# Patient Record
Sex: Male | Born: 1945 | Race: White | Hispanic: No | Marital: Married | Smoking: Never smoker
Health system: Southern US, Community
[De-identification: ages and names within clinical notes are randomized; demographics above are authoritative.]

## PROBLEM LIST (undated history)

## (undated) DIAGNOSIS — Z87442 Personal history of urinary calculi: Secondary | ICD-10-CM

## (undated) DIAGNOSIS — I10 Essential (primary) hypertension: Secondary | ICD-10-CM

## (undated) DIAGNOSIS — N400 Enlarged prostate without lower urinary tract symptoms: Secondary | ICD-10-CM

## (undated) DIAGNOSIS — G4730 Sleep apnea, unspecified: Secondary | ICD-10-CM

## (undated) DIAGNOSIS — K219 Gastro-esophageal reflux disease without esophagitis: Secondary | ICD-10-CM

## (undated) DIAGNOSIS — E119 Type 2 diabetes mellitus without complications: Secondary | ICD-10-CM

## (undated) HISTORY — PX: OTHER SURGICAL HISTORY: SHX169

## (undated) HISTORY — PX: ROTATOR CUFF REPAIR: SHX139

## (undated) HISTORY — PX: CHOLECYSTECTOMY: SHX55

---

## 2004-07-05 ENCOUNTER — Ambulatory Visit: Payer: Self-pay | Admitting: Cardiology

## 2004-07-06 ENCOUNTER — Ambulatory Visit: Payer: Self-pay | Admitting: Cardiology

## 2004-07-18 ENCOUNTER — Ambulatory Visit: Payer: Self-pay

## 2007-01-07 ENCOUNTER — Ambulatory Visit: Payer: Self-pay | Admitting: Unknown Physician Specialty

## 2008-05-24 ENCOUNTER — Ambulatory Visit: Payer: Self-pay | Admitting: Ophthalmology

## 2008-05-31 ENCOUNTER — Ambulatory Visit: Payer: Self-pay | Admitting: Ophthalmology

## 2009-10-23 ENCOUNTER — Ambulatory Visit: Payer: Self-pay | Admitting: Unknown Physician Specialty

## 2011-03-15 ENCOUNTER — Ambulatory Visit: Payer: Self-pay | Admitting: Sports Medicine

## 2011-06-14 ENCOUNTER — Ambulatory Visit: Payer: Self-pay | Admitting: Unknown Physician Specialty

## 2011-06-14 DIAGNOSIS — I10 Essential (primary) hypertension: Secondary | ICD-10-CM

## 2011-06-14 LAB — URINALYSIS, COMPLETE
Bacteria: NONE SEEN
Bilirubin,UR: NEGATIVE
Blood: NEGATIVE
Ketone: NEGATIVE
Ph: 5 (ref 4.5–8.0)
Protein: 30
Specific Gravity: 1.024 (ref 1.003–1.030)
WBC UR: <1 /HPF (ref 0–5)

## 2011-06-14 LAB — BASIC METABOLIC PANEL
Calcium, Total: 9.2 mg/dL (ref 8.5–10.1)
Co2: 28 mmol/L (ref 21–32)
Creatinine: 0.92 mg/dL (ref 0.60–1.30)
EGFR (African American): >60
EGFR (Non-African Amer.): >60
Glucose: 162 mg/dL — ABNORMAL HIGH (ref 65–99)
Potassium: 3.6 mmol/L (ref 3.5–5.1)
Sodium: 139 mmol/L (ref 136–145)

## 2011-06-14 LAB — CBC
MCH: 29.8 pg (ref 26.0–34.0)
MCHC: 34.8 g/dL (ref 32.0–36.0)
MCV: 86 fL (ref 80–100)
Platelet: 181 10*3/uL (ref 150–440)
RBC: 5.14 10*6/uL (ref 4.40–5.90)
RDW: 13.5 % (ref 11.5–14.5)

## 2011-06-20 ENCOUNTER — Ambulatory Visit: Payer: Self-pay | Admitting: Unknown Physician Specialty

## 2013-09-21 ENCOUNTER — Ambulatory Visit: Payer: Self-pay | Admitting: Ophthalmology

## 2013-10-05 ENCOUNTER — Ambulatory Visit: Payer: Self-pay | Admitting: Ophthalmology

## 2014-05-05 ENCOUNTER — Ambulatory Visit: Payer: Self-pay | Admitting: Ophthalmology

## 2014-07-29 ENCOUNTER — Ambulatory Visit: Payer: Self-pay | Admitting: Internal Medicine

## 2014-09-03 NOTE — Op Note (Signed)
PATIENT NAME:  Joseph Hensley, Joseph Hensley MR#:  [id number] DATE OF BIRTH:  1945/08/16  DATE OF PROCEDURE:  10/05/2013  PREOPERATIVE DIAGNOSIS: Visually significant cataract of the right eye.   POSTOPERATIVE DIAGNOSIS: Visually significant cataract of the right eye.   OPERATIVE PROCEDURE: Cataract extraction by phacoemulsification with implant of intraocular lens to right eye.   SURGEON: Galen ManilaWilliam Donielle Radziewicz, MD.   ANESTHESIA:  1.  Managed anesthesia care.  2.  Topical tetracaine drops followed by 2% Xylocaine jelly applied in the preoperative holding area.   COMPLICATIONS: None.   TECHNIQUE:  Stop and chop.  DESCRIPTION OF PROCEDURE: The patient was examined and consented in the preoperative holding area where the aforementioned topical anesthesia was applied to the right eye and then brought back to the Operating Room where the right eye was prepped and draped in the usual sterile ophthalmic fashion and a lid speculum was placed. A paracentesis was created with the side port blade and the anterior chamber was filled with viscoelastic. A near clear corneal incision was performed with the steel keratome. A continuous curvilinear capsulorrhexis was performed with a cystotome followed by the capsulorrhexis forceps. Hydrodissection and hydrodelineation were carried out with BSS on a blunt cannula. The lens was removed in a stop and chop technique and the remaining cortical material was removed with the irrigation-aspiration handpiece. The capsular bag was inflated with viscoelastic and the Alcon SN60WF 19.0-diopter lens, serial number [id number] was placed in the capsular bag without complication. The remaining viscoelastic was removed from the eye with the irrigation-aspiration handpiece. The wounds were hydrated. The anterior chamber was flushed with Miostat and the eye was inflated to physiologic pressure. 0.1 mL of cefuroxime concentration 10 mg/mL was placed in the anterior chamber. The wounds were found to  be water tight. The eye was dressed with Vigamox. The patient was given protective glasses to wear throughout the day and a shield with which to sleep tonight. The patient was also given drops with which to begin a drop regimen today and will follow-up with me in one day.      ____________________________ Jerilee FieldWilliam L. Ivanka Kirshner, MD wlp:dmm D: 10/05/2013 17:17:50 ET T: 10/05/2013 19:37:28 ET JOB#: [id number]  cc: Earl Zellmer L. Abdelrahman Nair, MD, <Dictator> Jerilee FieldWILLIAM L Davaughn Hillyard MD ELECTRONICALLY SIGNED 10/13/2013 13:42

## 2014-09-04 NOTE — Op Note (Signed)
PATIENT NAME:  [name], Joseph Hensley#:  [id number] DATE OF BIRTH:  11/26/1945  DATE OF PROCEDURE:  06/20/2011  PROCEDURE: Left interscalene nerve block.   INDICATION: To help this patient with postoperative pain about to have a right shoulder arthroscopy, rotator cuff repair, subacromial decompression, and distal clavicle resection by Dr. Ruthann CancerJames Califf.  ATTENDING: Keyarah Mcroy P. Rubye OaksPalmer, MD    DESCRIPTION OF PROCEDURE: The risks and benefits of the procedure as well as general anesthesia were discussed with the patient preoperatively  in Same Day Surgery and were accepted. The patient was brought to the PAC-U preoperatively. The usual monitors were applied. He was placed on nasal cannula oxygen. Confirmation of the site and procedure were again verified. He was lightly sedated with a total of 3 mg of Versed intravenously. He was still awake, talkative, and in good verbal communication with us but much more comfortable. The usual landmarks were observed. He had a Betadine prep of his left side and anterior neck x3. A sterile technique was used. A 22-gauge 2-inch Stimuplex needle was advanced between the scalene muscles with 0.7 mA of output. There was good left arm movement on the first pass. There was no heme, CSF, or paresthesia. He had a total of 30 mL of 0.25% bupivacaine with 1:400,000 of epinephrine injected incrementally. He tolerated the block without problem or complication. His arm was becoming numb prior to him going back to the OR. I am hopeful that the block will help him with postoperative pain for the duration of the block.   ____________________________ Kirby CriglerScott P. Rubye OaksPalmer, MD spp:drc D: 06/20/2011 09:33:17 ET T: 06/20/2011 09:42:12 ET JOB#: [id number]  cc: Lorin PicketScott P. Rubye OaksPalmer, MD, <Dictator> Heriberto AntiguaSCOTT Alven Alverio MD ELECTRONICALLY SIGNED 06/20/2011 9:57

## 2014-09-04 NOTE — Op Note (Signed)
PATIENT NAME:  Joseph Hensley, Joseph Hensley MR#:  [id number] DATE OF BIRTH:  [birth year]  DATE OF PROCEDURE:  06/20/2011  PREOPERATIVE DIAGNOSIS: Rotator cuff tear, left shoulder, with acromioclavicular joint arthritis and impingement.   POSTOPERATIVE DIAGNOSES:  1. Rotator cuff tear, left shoulder.  2. Acromioclavicular joint arthritis, left shoulder.  3. Impingement syndrome, left shoulder.  4. Labral tear, left shoulder.   PROCEDURES:  1. Arthroscopic rotator cuff repair, left shoulder.  2. Arthroscopic subacromial decompression with release of coracoacromial ligament.  3. Arthroscopic resection of distal clavicle.  4. Arthroscopic debridement of labral tear and glenohumeral joint, left shoulder.   SURGEON: Winn JockJames C. Kiel Cockerell, M.D.   ASSISTANT: None.   ANESTHESIA: General with scalene block.   ESTIMATED BLOOD LOSS: Negligible.   COMPLICATIONS: None.   IMPLANTS USED: One ArthroCare 5.5 mm speed screw.   BRIEF CLINICAL NOTE AND PATHOLOGY: The patient had persistent shoulder pain unresponsive to conservative treatment. Work-up showed evidence of a rotator cuff tear with impingement and AC joint arthritis. Options, risks, and benefits were discussed in detail. The patient elected to proceed with operative intervention. At the time of the procedure, a complete tear was noted. The tear repaired nicely. There was marked impingement with an extremely large subacromial spur.   DESCRIPTION OF PROCEDURE: Adequate general anesthesia after scalene block, beach chair position, routine prepping and draping. Appropriate time out was called. The Arthroscope was introduced through a routine posterior portal, anterior portal was made with the guidance of a spinal needle. The glenohumeral joint was inspected, debridement of labral tear was performed. Attention was then turned to the undersurface of the rotator cuff where the tear was appreciated. There were minimal glenohumeral changes. There was no evidence of  instability.   The scope was then passed into the subacromial space. The marked impingement was noted. The subacromial decompression and the bursa were removed from the lateral portal using first the Arthro-Wand and the bur, using the en bloc technique. The distal clavicle was then resected using the bur from both the anterior and lateral portals. This was done from inferior to superior and dorsal ligaments were protected.   Attention was then turned to repairing the rotator cuff where the edges were debrided. The footprint was burred developed down to bleeding bone. The anchor was placed after the suture had been placed in the rotator cuff and the tear repaired nicely.   The shoulder was taken through range of motion with no further impingement. The skin was thoroughly irrigated and inspected, no impingement noted. The shoulder was drained of fluid. The portals were closed with nylon. The area injected with Marcaine with epinephrine. A soft sterile dressing was applied. Sponge and needle counts were reported as correct prior to and after wound closure. The patient was awakened and taken to the PAC-U having tolerated the procedure well. ____________________________ Winn JockJames C. Gerrit Heckaliff, MD jcc:slb D: 06/20/2011 13:58:20 ET T: 06/20/2011 14:08:25 ET JOB#: [id number]  cc: Winn JockJames C. Gerrit Heckaliff, MD, <Dictator> Winn JockJAMES C Dae Highley MD ELECTRONICALLY SIGNED 06/25/2011 19:33

## 2015-06-22 ENCOUNTER — Emergency Department: Payer: Medicare Other

## 2015-06-22 ENCOUNTER — Encounter: Payer: Self-pay | Admitting: Emergency Medicine

## 2015-06-22 ENCOUNTER — Emergency Department
Admission: EM | Admit: 2015-06-22 | Discharge: 2015-06-22 | Disposition: A | Discharge status: Home / Self Care | Payer: Medicare Other | Attending: Emergency Medicine | Admitting: Emergency Medicine

## 2015-06-22 DIAGNOSIS — N23 Unspecified renal colic: Secondary | ICD-10-CM | POA: Insufficient documentation

## 2015-06-22 DIAGNOSIS — E119 Type 2 diabetes mellitus without complications: Secondary | ICD-10-CM | POA: Diagnosis not present

## 2015-06-22 DIAGNOSIS — R197 Diarrhea, unspecified: Secondary | ICD-10-CM | POA: Insufficient documentation

## 2015-06-22 DIAGNOSIS — R109 Unspecified abdominal pain: Secondary | ICD-10-CM | POA: Diagnosis present

## 2015-06-22 HISTORY — DX: Type 2 diabetes mellitus without complications: E11.9

## 2015-06-22 LAB — CBC
HCT: 42.6 % (ref 40.0–52.0)
Hemoglobin: 15 g/dL (ref 13.0–18.0)
MCH: 29.4 pg (ref 26.0–34.0)
MCHC: 35.2 g/dL (ref 32.0–36.0)
MCV: 83.6 fL (ref 80.0–100.0)
PLATELETS: 192 10*3/uL (ref 150–440)
RBC: 5.1 MIL/uL (ref 4.40–5.90)
RDW: 14.2 % (ref 11.5–14.5)
WBC: 10.1 10*3/uL (ref 3.8–10.6)

## 2015-06-22 LAB — URINALYSIS COMPLETE WITH MICROSCOPIC (ARMC ONLY)
BILIRUBIN URINE: NEGATIVE
Bacteria, UA: NONE SEEN
Glucose, UA: NEGATIVE mg/dL
KETONES UR: NEGATIVE mg/dL
LEUKOCYTES UA: NEGATIVE
NITRITE: NEGATIVE
PH: 6 (ref 5.0–8.0)
PROTEIN: 100 mg/dL — AB
SPECIFIC GRAVITY, URINE: 1.018 (ref 1.005–1.030)
SQUAMOUS EPITHELIAL / LPF: NONE SEEN

## 2015-06-22 LAB — COMPREHENSIVE METABOLIC PANEL
ALT: 44 U/L (ref 17–63)
AST: 43 U/L — ABNORMAL HIGH (ref 15–41)
Albumin: 4.9 g/dL (ref 3.5–5.0)
Alkaline Phosphatase: 47 U/L (ref 38–126)
Anion gap: 13 (ref 5–15)
BILIRUBIN TOTAL: 1.1 mg/dL (ref 0.3–1.2)
BUN: 27 mg/dL — AB (ref 6–20)
CHLORIDE: 104 mmol/L (ref 101–111)
CO2: 25 mmol/L (ref 22–32)
CREATININE: 1.46 mg/dL — AB (ref 0.61–1.24)
Calcium: 10.3 mg/dL (ref 8.9–10.3)
GFR, EST AFRICAN AMERICAN: 54 mL/min — AB (ref >60)
GFR, EST NON AFRICAN AMERICAN: 47 mL/min — AB (ref >60)
Glucose, Bld: 148 mg/dL — ABNORMAL HIGH (ref 65–99)
Potassium: 3.7 mmol/L (ref 3.5–5.1)
Sodium: 142 mmol/L (ref 135–145)
TOTAL PROTEIN: 7.6 g/dL (ref 6.5–8.1)

## 2015-06-22 MED ORDER — ONDANSETRON 4 MG PO TBDP: 4.0000 mg | ORAL_TABLET | Freq: Three times a day (TID) | ORAL | Status: DC | PRN

## 2015-06-22 MED ORDER — KETOROLAC TROMETHAMINE 30 MG/ML IJ SOLN
15.0000 mg | Freq: Once | INTRAMUSCULAR | Status: AC
  Administered 2015-06-22: 15 mg via INTRAVENOUS
  Filled 2015-06-22: qty 1

## 2015-06-22 MED ORDER — MORPHINE SULFATE (PF) 4 MG/ML IV SOLN
4.0000 mg | Freq: Once | INTRAVENOUS | Status: AC
  Administered 2015-06-22: 4 mg via INTRAVENOUS
  Filled 2015-06-22: qty 1

## 2015-06-22 MED ORDER — TAMSULOSIN HCL 0.4 MG PO CAPS: 0.4000 mg | ORAL_CAPSULE | Freq: Every day | ORAL | Status: DC

## 2015-06-22 MED ORDER — OXYCODONE-ACETAMINOPHEN 5-325 MG PO TABS
ORAL_TABLET | ORAL | Status: AC
  Filled 2015-06-22: qty 2

## 2015-06-22 MED ORDER — OXYCODONE-ACETAMINOPHEN 5-325 MG PO TABS: 2.0000 | ORAL_TABLET | Freq: Four times a day (QID) | ORAL | Status: DC | PRN

## 2015-06-22 MED ORDER — OXYCODONE-ACETAMINOPHEN 5-325 MG PO TABS
2.0000 | ORAL_TABLET | Freq: Once | ORAL | Status: AC
Start: 2015-06-22 — End: 2015-06-22
  Administered 2015-06-22: 2 via ORAL

## 2015-06-22 MED ORDER — SODIUM CHLORIDE 0.9 % IV SOLN
Freq: Once | INTRAVENOUS | Status: AC
  Administered 2015-06-22: 1000 mL via INTRAVENOUS

## 2015-06-22 MED ORDER — ONDANSETRON HCL 4 MG/2ML IJ SOLN
4.0000 mg | Freq: Once | INTRAMUSCULAR | Status: AC
  Administered 2015-06-22: 4 mg via INTRAVENOUS
  Filled 2015-06-22: qty 2

## 2015-06-22 NOTE — ED Notes (Signed)
Pt reports left sided flank pain intermittently x3 days; reports left side is tender to touch. Pt reports some decreased urination.

## 2015-06-22 NOTE — Discharge Instructions (Signed)
Kidney Stones °Kidney stones (urolithiasis) are deposits that form inside your kidneys. The intense pain is caused by the stone moving through the urinary tract. When the stone moves, the ureter goes into spasm around the stone. The stone is usually passed in the urine.  °CAUSES  °· A disorder that makes certain neck glands produce too much parathyroid hormone (primary hyperparathyroidism). °· A buildup of uric acid crystals, similar to gout in your joints. °· Narrowing (stricture) of the ureter. °· A kidney obstruction present at birth (congenital obstruction). °· Previous surgery on the kidney or ureters. °· Numerous kidney infections. °SYMPTOMS  °· Feeling sick to your stomach (nauseous). °· Throwing up (vomiting). °· Blood in the urine (hematuria). °· Pain that usually spreads (radiates) to the groin. °· Frequency or urgency of urination. °DIAGNOSIS  °· Taking a history and physical exam. °· Blood or urine tests. °· CT scan. °· Occasionally, an examination of the inside of the urinary bladder (cystoscopy) is performed. °TREATMENT  °· Observation. °· Increasing your fluid intake. °· Extracorporeal shock wave lithotripsy--This is a noninvasive procedure that uses shock waves to break up kidney stones. °· Surgery may be needed if you have severe pain or persistent obstruction. There are various surgical procedures. Most of the procedures are performed with the use of small instruments. Only small incisions are needed to accommodate these instruments, so recovery time is minimized. °The size, location, and chemical composition are all important variables that will determine the proper choice of action for you. Talk to your health care provider to better understand your situation so that you will minimize the risk of injury to yourself and your kidney.  °HOME CARE INSTRUCTIONS  °· Drink enough water and fluids to keep your urine clear or pale yellow. This will help you to pass the stone or stone fragments. °· Strain  all urine through the provided strainer. Keep all particulate matter and stones for your health care provider to see. The stone causing the pain may be as small as a grain of salt. It is very important to use the strainer each and every time you pass your urine. The collection of your stone will allow your health care provider to analyze it and verify that a stone has actually passed. The stone analysis will often identify what you can do to reduce the incidence of recurrences. °· Only take over-the-counter or prescription medicines for pain, discomfort, or fever as directed by your health care provider. °· Keep all follow-up visits as told by your health care provider. This is important. °· Get follow-up X-rays if required. The absence of pain does not always mean that the stone has passed. It may have only stopped moving. If the urine remains completely obstructed, it can cause loss of kidney function or even complete destruction of the kidney. It is your responsibility to make sure X-rays and follow-ups are completed. Ultrasounds of the kidney can show blockages and the status of the kidney. Ultrasounds are not associated with any radiation and can be performed easily in a matter of minutes. °· Make changes to your daily diet as told by your health care provider. You may be told to: °¨ Limit the amount of salt that you eat. °¨ Eat 5 or more servings of fruits and vegetables each day. °¨ Limit the amount of meat, poultry, fish, and eggs that you eat. °· Collect a 24-hour urine sample as told by your health care provider. You may need to collect another urine sample every 6-12   months. °SEEK MEDICAL CARE IF: °· You experience pain that is progressive and unresponsive to any pain medicine you have been prescribed. °SEEK IMMEDIATE MEDICAL CARE IF:  °· Pain cannot be controlled with the prescribed medicine. °· You have a fever or shaking chills. °· The severity or intensity of pain increases over 18 hours and is not  relieved by pain medicine. °· You develop a new onset of abdominal pain. °· You feel faint or pass out. °· You are unable to urinate. °  °This information is not intended to replace advice given to you by your health care provider. Make sure you discuss any questions you have with your health care provider. °  °Document Released: 04/29/2005 Document Revised: 01/18/2015 Document Reviewed: 09/30/2012 °Elsevier Interactive Patient Education ©2016 [employer] ° °

## 2015-06-22 NOTE — ED Provider Notes (Signed)
[facility] Emergency Department Provider Note     Time seen: ----------------------------------------- 2:12 PM on 06/22/2015 -----------------------------------------    I have reviewed the triage vital signs and the nursing notes.   HISTORY  Chief Complaint Flank Pain    HPI Joseph CARTON Sr. is a 70 y.o. male who presents ER with left side pain for 3 days with diarrhea. Patient states the left flank is tender to touch, he denies fevers, chills, chest pain, shortness of breath or other complaints at this time. Currently he states it feels like something is given a burst of his left flank. Pain is severe this time.   Past Medical History  Diagnosis Date  . Diabetes mellitus without complication (HCC)     There are no active problems to display for this patient.   Past Surgical History  Procedure Laterality Date  . Cholecystectomy    . Rotator cuff repair Left     Allergies Review of patient's allergies indicates no known allergies.  Social History Social History  Substance Use Topics  . Smoking status: Never Smoker   . Smokeless tobacco: None  . Alcohol Use: No    Review of Systems Constitutional: Negative for fever. Eyes: Negative for visual changes. ENT: Negative for sore throat. Cardiovascular: Negative for chest pain. Respiratory: Negative for shortness of breath. Gastrointestinal: Positive for left flank pain, diarrhea Genitourinary: Negative for dysuria. Musculoskeletal: Negative for back pain. Skin: Negative for rash. Neurological: Negative for headaches, focal weakness or numbness.  10-point ROS otherwise negative.  ____________________________________________   PHYSICAL EXAM:  VITAL SIGNS: ED Triage Vitals  Enc Vitals Group     BP 06/22/15 1357 161/66 mmHg     Pulse Rate 06/22/15 1357 78     Resp 06/22/15 1357 22     Temp 06/22/15 1357 97.5 F (36.4 C)     Temp Source 06/22/15 1357 Oral     SpO2  06/22/15 1357 100 %     Weight 06/22/15 1357 221 lb (100.245 kg)     Height 06/22/15 1357  (1.753 m)     Head Cir --      Peak Flow --      Pain Score 06/22/15 1402 10     Pain Loc --      Pain Edu? --      Excl. in GC? --     Constitutional: Alert and oriented. Mild to moderate distress Eyes: Conjunctivae are normal. PERRL. Normal extraocular movements. ENT   Head: Normocephalic and atraumatic.   Nose: No congestion/rhinnorhea.   Mouth/Throat: Mucous membranes are moist.   Neck: No stridor. Cardiovascular: Normal rate, regular rhythm. Normal and symmetric distal pulses are present in all extremities. No murmurs, rubs, or gallops. Respiratory: Normal respiratory effort without tachypnea nor retractions. Breath sounds are clear and equal bilaterally. No wheezes/rales/rhonchi. Gastrointestinal: Left flank tenderness, normal bowel sounds. Musculoskeletal: Nontender with normal range of motion in all extremities. No joint effusions.  No lower extremity tenderness nor edema. Neurologic:  Normal speech and language. No gross focal neurologic deficits are appreciated.  Skin:  Skin is warm, dry and intact. No rash noted. Psychiatric: Mood and affect are normal. Speech and behavior are normal. Patient exhibits appropriate insight and judgment. ___________________________________________  ED COURSE:  Pertinent labs & imaging results that were available during my care of the patient were reviewed by me and considered in my medical decision making (see chart for details). Patient is in some distress from the severity of the  pain. He will receive IV pain medication and CT imaging. ____________________________________________    LABS (pertinent positives/negatives)  Labs Reviewed  COMPREHENSIVE METABOLIC PANEL - Abnormal; Notable for the following:    Glucose, Bld 148 (*)    BUN 27 (*)    Creatinine, Ser 1.46 (*)    AST 43 (*)    GFR calc non Af Amer 47 (*)    GFR calc  Af Amer 54 (*)    All other components within normal limits  URINALYSIS COMPLETEWITH MICROSCOPIC ([facility] ONLY) - Abnormal; Notable for the following:    Color, Urine YELLOW (*)    APPearance CLEAR (*)    Hgb urine dipstick 3+ (*)    Protein, ur 100 (*)    All other components within normal limits  CBC    RADIOLOGY Images were viewed by me  CT renal protocol IMPRESSION: 1. Mild left-sided hydroureteronephrosis secondary to a 5 mm proximal left ureteric calculus. 2. Right nephrolithiasis. 3. Hepatomegaly and hepatic steatosis. 4. 4 mm right lung base nodule. If the patient is at high risk for bronchogenic carcinoma, follow-up chest CT at 1 year is recommended. If the patient is at low risk, no follow-up is needed. This recommendation follows the consensus statement: "Guidelines for Management of Small Pulmonary Nodules Detected on CT Scans: A Statement from the Fleischner Society" as published in Radiology 2005; 237:395-400. Available online at: DietDisorder.cz. ____________________________________________  FINAL ASSESSMENT AND PLAN  Flank pain, renal colic  Plan: Patient with labs and imaging as dictated above. Patient be discharged with pain medication and Flomax. No signs of urinary infection, he is encouraged to have increased by mouth intake. Was given a liter of saline prior to discharge.   Emily Filbert, MD   Emily Filbert, MD 06/22/15 [phone]

## 2016-04-10 ENCOUNTER — Other Ambulatory Visit: Payer: Self-pay | Admitting: Family Medicine

## 2016-04-10 DIAGNOSIS — R911 Solitary pulmonary nodule: Secondary | ICD-10-CM

## 2016-04-17 ENCOUNTER — Ambulatory Visit: Payer: Medicare Other

## 2016-07-26 IMAGING — CT CT RENAL STONE PROTOCOL
3 of 4 series · 10 of 46 positions shown, 17 images · non-contrast
Comparison: None.

CLINICAL DATA: Intermittent left flank pain for 3 days. Decreased
urination. Diabetes.

EXAM:
CT ABDOMEN AND PELVIS WITHOUT CONTRAST
TECHNIQUE: Multidetector CT imaging of the abdomen and pelvis was performed
following the standard protocol without IV contrast.

[Series 4: lung · axial · 0.92mm/px · z∈[-444,-324]mm · 6 of 34 slices shown, 11 images]
[im 5/34  soft-tissue]
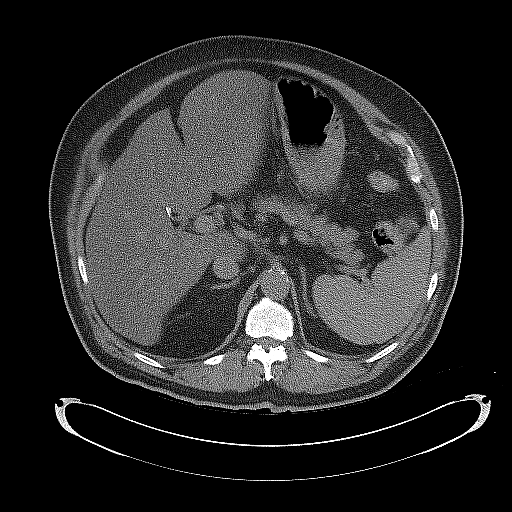
[im 5/34  bone]
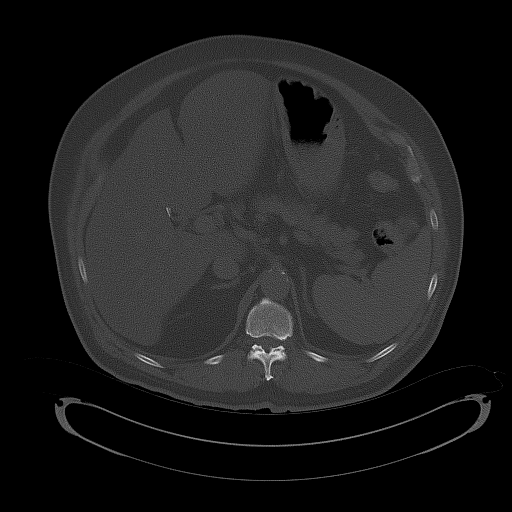
[im 10/34  soft-tissue]
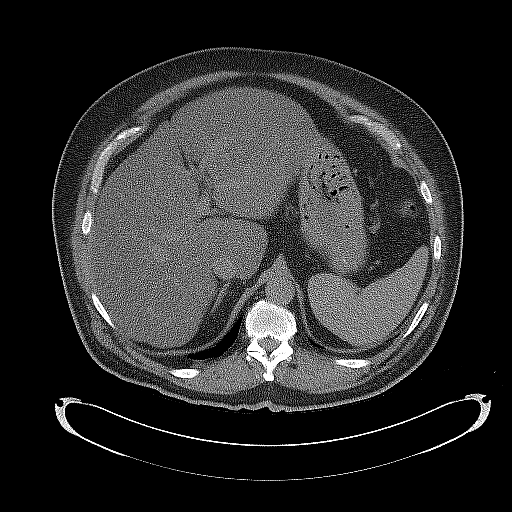
[im 15/34  soft-tissue]
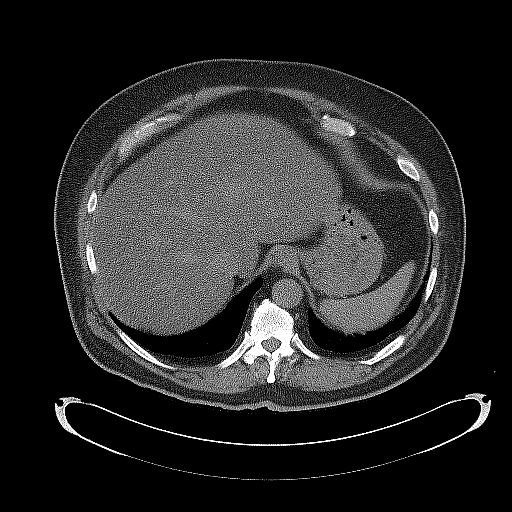
[im 15/34  lung]
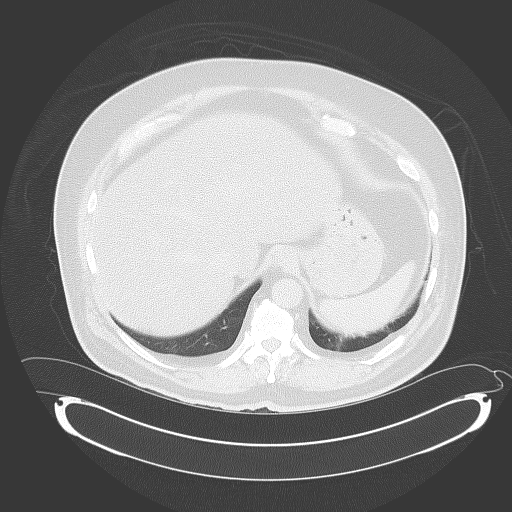
[im 19/34  soft-tissue]
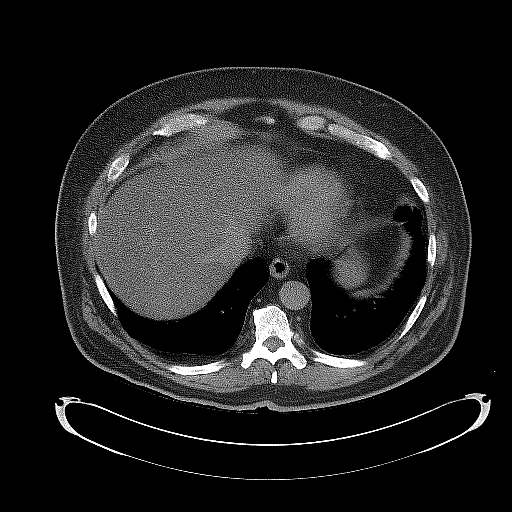
[im 19/34  lung]
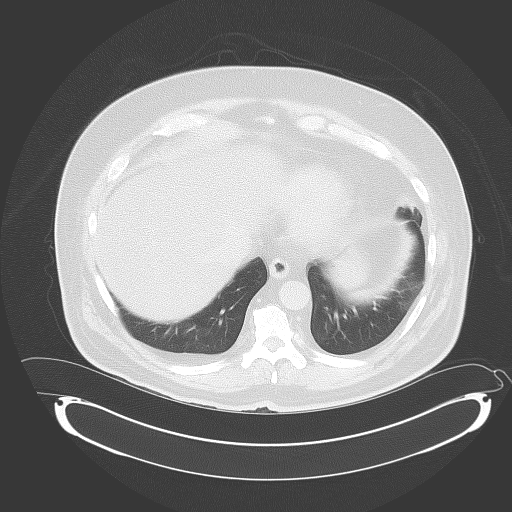
[im 24/34  soft-tissue]
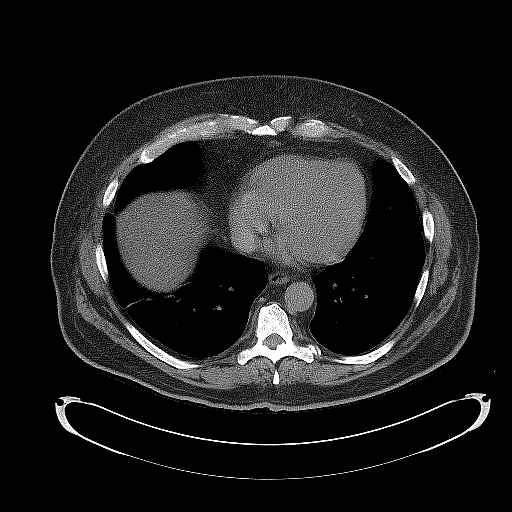
[im 24/34  lung]
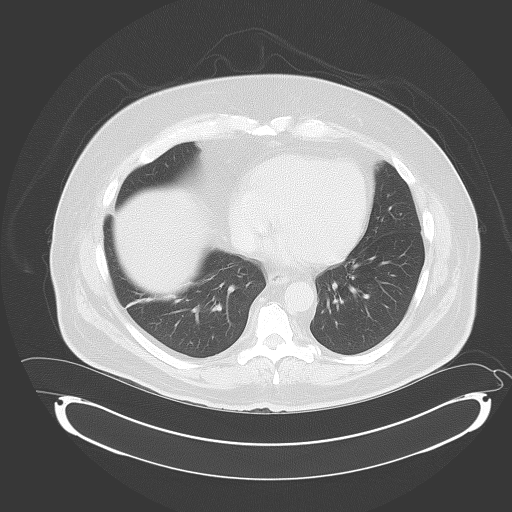
[im 29/34  soft-tissue]
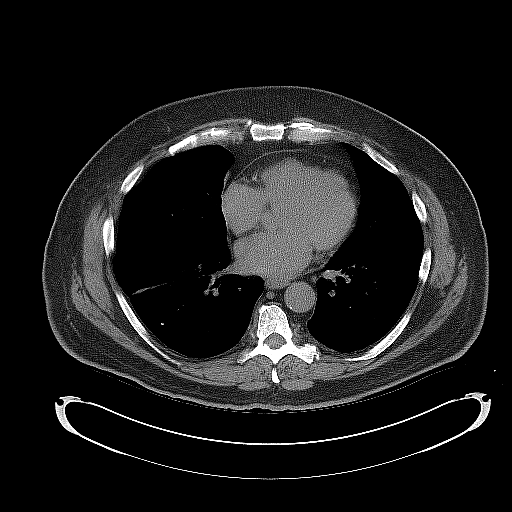
[im 29/34  lung]
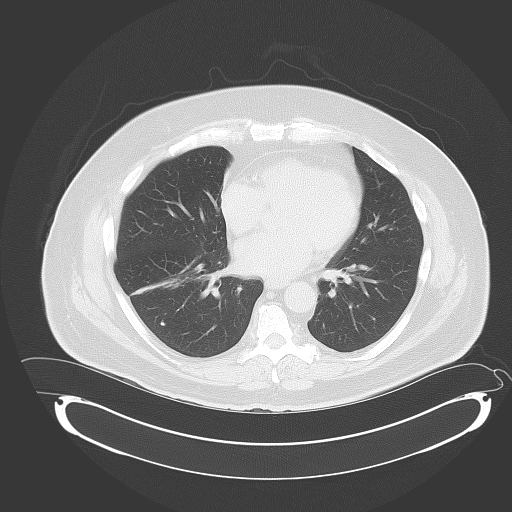

[Series 5: coronal · coronal · 0.86mm/px · 3 of 203 slices shown, 4 images]
[im 68/203  soft-tissue]
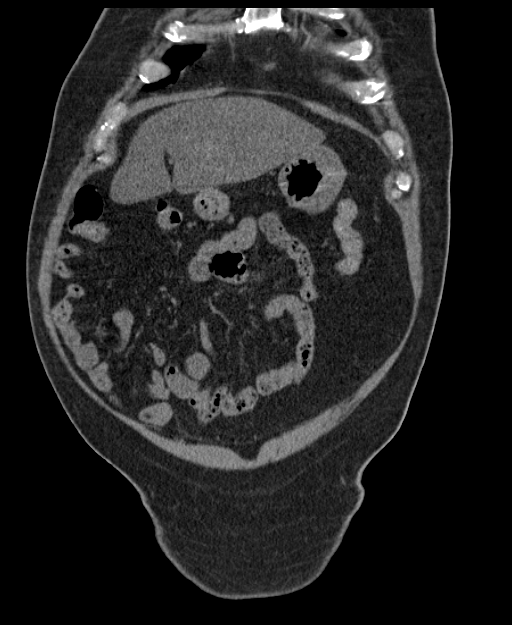
[im 90/203  soft-tissue]
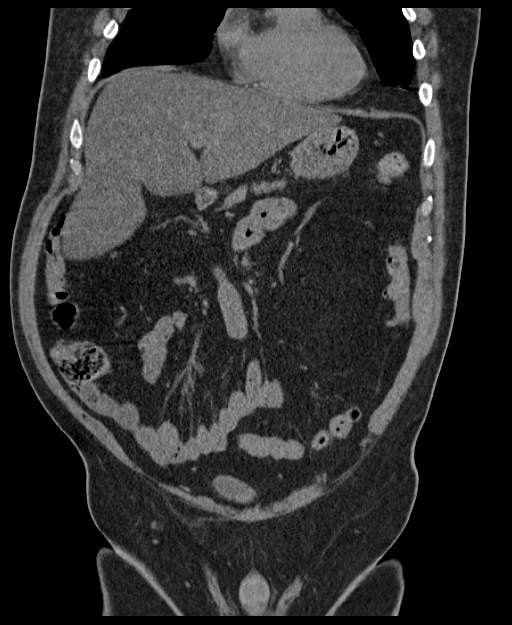
[im 90/203  bone]
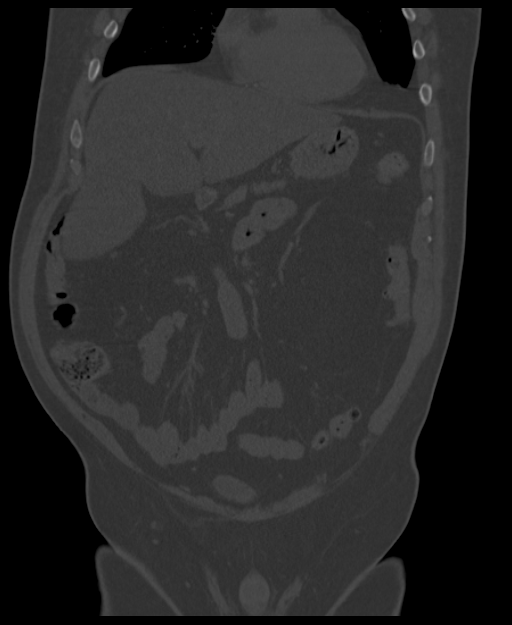
[im 113/203  soft-tissue]
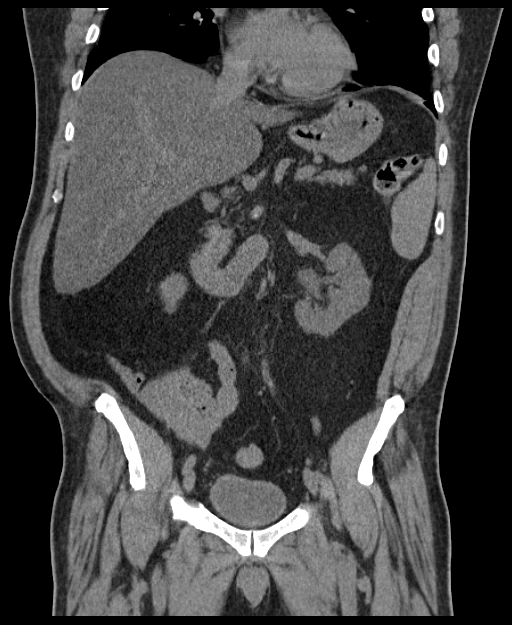

[Series 6: sagittal · sagittal · 0.83mm/px · 1 of 220 slices shown, 2 images]
[im 74/220  soft-tissue]
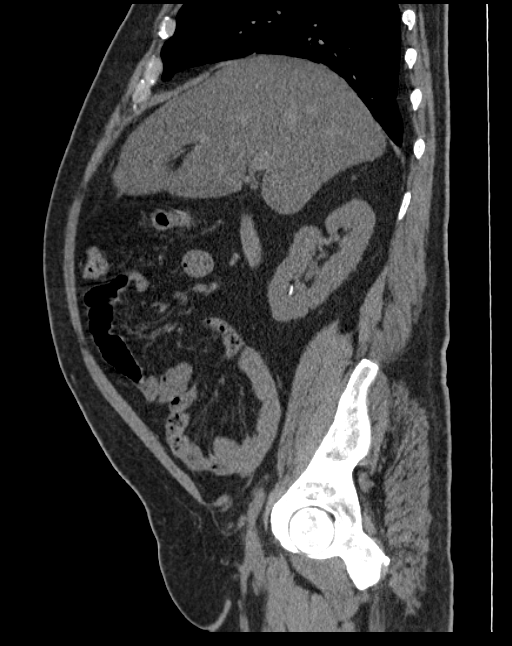
[im 74/220  bone]
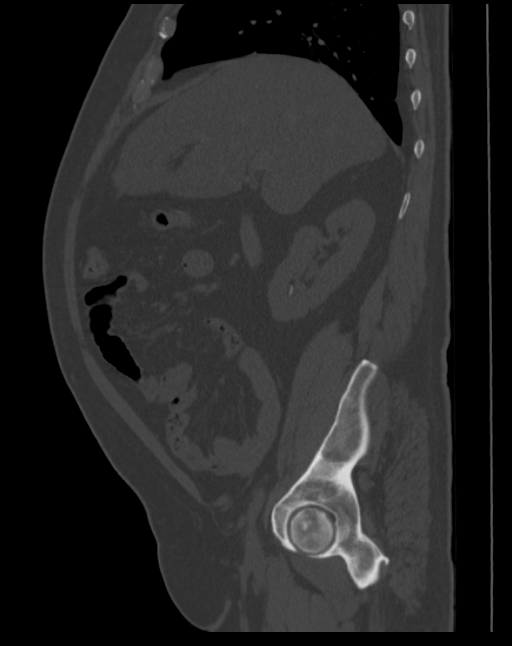

[10 of 46 positions shown; findings below may reference images not displayed]

FINDINGS: Lower chest: Right base atelectasis. A right lower lobe pulmonary
nodule measures 4 mm on image 6/series 4. Normal heart size without
pericardial or pleural effusion.

Hepatobiliary: Moderate hepatomegaly and marked hepatic steatosis.
21.8 cm craniocaudal. Cholecystectomy, without biliary ductal
dilatation.

Pancreas: Normal, without mass or ductal dilatation.

Spleen: Normal in size, without focal abnormality.

Adrenals/Urinary Tract: Normal adrenal glands. Stone or conglomerate
of stones in the interpolar right kidney measures 7 mm. No
left-sided renal collecting system calculi. Mild left-sided
hydroureteronephrosis to the level of a 5 mm proximal left ureteric
stone. No bladder or distal ureteric calculi.

Stomach/Bowel: Normal stomach, without wall thickening. Scattered
colonic diverticula. Normal terminal ileum. Normal small bowel.

Vascular/Lymphatic: Aortic and branch vessel atherosclerosis. No
abdominopelvic adenopathy.

Reproductive: Mild prostatomegaly.

Other: No significant free fluid. Small fat containing bilateral
inguinal hernias.

Musculoskeletal: Degenerative partial fusion of the bilateral
sacroiliac joints.
IMPRESSION: 1. Mild left-sided hydroureteronephrosis secondary to a 5 mm
proximal left ureteric calculus.
2. Right nephrolithiasis.
3. Hepatomegaly and hepatic steatosis.
4. 4 mm right lung base nodule. If the patient is at high risk for
bronchogenic carcinoma, follow-up chest CT at 1 year is recommended.
If the patient is at low risk, no follow-up is needed. This
recommendation follows the consensus statement: "Guidelines for
Management of Small Pulmonary Nodules Detected on CT Scans: A
Statement from the [HOSPITAL]" as published in Radiology
4007; [DATE]. Available online at:
[URL]

## 2017-04-01 ENCOUNTER — Encounter: Payer: Self-pay | Admitting: *Deleted

## 2017-04-02 ENCOUNTER — Ambulatory Visit: Payer: Medicare Other | Admitting: Anesthesiology

## 2017-04-02 ENCOUNTER — Encounter
Admission: RE | Disposition: A | Discharge status: Home / Self Care | Payer: Self-pay | Source: Ambulatory Visit | Attending: Unknown Physician Specialty

## 2017-04-02 ENCOUNTER — Ambulatory Visit
Admission: RE | Admit: 2017-04-02 | Discharge: 2017-04-02 | Disposition: A | Discharge status: Home / Self Care | Payer: Medicare Other | Source: Ambulatory Visit | Attending: Unknown Physician Specialty | Admitting: Unknown Physician Specialty

## 2017-04-02 ENCOUNTER — Encounter: Payer: Self-pay | Admitting: *Deleted

## 2017-04-02 ENCOUNTER — Encounter
Admission: RE | Disposition: A | Discharge status: Still In Hospital | Payer: Self-pay | Source: Ambulatory Visit | Attending: Unknown Physician Specialty

## 2017-04-02 ENCOUNTER — Ambulatory Visit
Admission: RE | Admit: 2017-04-02 | Discharge: 2017-04-02 | Disposition: A | Discharge status: Still In Hospital | Payer: Medicare Other | Source: Ambulatory Visit | Attending: Unknown Physician Specialty | Admitting: Unknown Physician Specialty

## 2017-04-02 ENCOUNTER — Encounter: Payer: Self-pay | Admitting: Anesthesiology

## 2017-04-02 DIAGNOSIS — E119 Type 2 diabetes mellitus without complications: Secondary | ICD-10-CM | POA: Insufficient documentation

## 2017-04-02 DIAGNOSIS — E669 Obesity, unspecified: Secondary | ICD-10-CM

## 2017-04-02 DIAGNOSIS — Z79899 Other long term (current) drug therapy: Secondary | ICD-10-CM

## 2017-04-02 DIAGNOSIS — K5730 Diverticulosis of large intestine without perforation or abscess without bleeding: Secondary | ICD-10-CM | POA: Insufficient documentation

## 2017-04-02 DIAGNOSIS — I2510 Atherosclerotic heart disease of native coronary artery without angina pectoris: Secondary | ICD-10-CM

## 2017-04-02 DIAGNOSIS — I4891 Unspecified atrial fibrillation: Secondary | ICD-10-CM

## 2017-04-02 DIAGNOSIS — Z7951 Long term (current) use of inhaled steroids: Secondary | ICD-10-CM | POA: Insufficient documentation

## 2017-04-02 DIAGNOSIS — N400 Enlarged prostate without lower urinary tract symptoms: Secondary | ICD-10-CM | POA: Insufficient documentation

## 2017-04-02 DIAGNOSIS — K219 Gastro-esophageal reflux disease without esophagitis: Secondary | ICD-10-CM | POA: Insufficient documentation

## 2017-04-02 DIAGNOSIS — Z794 Long term (current) use of insulin: Secondary | ICD-10-CM | POA: Insufficient documentation

## 2017-04-02 DIAGNOSIS — K640 First degree hemorrhoids: Secondary | ICD-10-CM

## 2017-04-02 DIAGNOSIS — Z9049 Acquired absence of other specified parts of digestive tract: Secondary | ICD-10-CM | POA: Insufficient documentation

## 2017-04-02 DIAGNOSIS — Z86010 Personal history of colonic polyps: Secondary | ICD-10-CM | POA: Insufficient documentation

## 2017-04-02 DIAGNOSIS — I10 Essential (primary) hypertension: Secondary | ICD-10-CM | POA: Insufficient documentation

## 2017-04-02 DIAGNOSIS — G4733 Obstructive sleep apnea (adult) (pediatric): Secondary | ICD-10-CM | POA: Insufficient documentation

## 2017-04-02 DIAGNOSIS — Z6833 Body mass index (BMI) 33.0-33.9, adult: Secondary | ICD-10-CM | POA: Insufficient documentation

## 2017-04-02 DIAGNOSIS — E781 Pure hyperglyceridemia: Secondary | ICD-10-CM | POA: Insufficient documentation

## 2017-04-02 DIAGNOSIS — Z1211 Encounter for screening for malignant neoplasm of colon: Secondary | ICD-10-CM

## 2017-04-02 DIAGNOSIS — Z7982 Long term (current) use of aspirin: Secondary | ICD-10-CM | POA: Insufficient documentation

## 2017-04-02 HISTORY — DX: Personal history of urinary calculi: Z87.442

## 2017-04-02 HISTORY — DX: Sleep apnea, unspecified: G47.30

## 2017-04-02 HISTORY — PX: COLONOSCOPY WITH PROPOFOL: SHX5780

## 2017-04-02 HISTORY — DX: Benign prostatic hyperplasia without lower urinary tract symptoms: N40.0

## 2017-04-02 HISTORY — PX: COLONOSCOPY: SHX5424

## 2017-04-02 HISTORY — DX: Gastro-esophageal reflux disease without esophagitis: K21.9

## 2017-04-02 HISTORY — DX: Essential (primary) hypertension: I10

## 2017-04-02 LAB — GLUCOSE, CAPILLARY: GLUCOSE-CAPILLARY: 209 mg/dL — AB (ref 65–99)

## 2017-04-02 SURGERY — COLONOSCOPY WITH PROPOFOL
Anesthesia: General

## 2017-04-02 SURGERY — COLONOSCOPY
Anesthesia: General

## 2017-04-02 MED ORDER — SODIUM CHLORIDE 0.9 % IV SOLN: INTRAVENOUS | Status: DC

## 2017-04-02 MED ORDER — PROPOFOL 500 MG/50ML IV EMUL
INTRAVENOUS | Status: AC
  Filled 2017-04-02: qty 50

## 2017-04-02 MED ORDER — MIDAZOLAM HCL 2 MG/2ML IJ SOLN
INTRAMUSCULAR | Status: AC
  Filled 2017-04-02: qty 2

## 2017-04-02 MED ORDER — SODIUM CHLORIDE 0.9 % IV SOLN
INTRAVENOUS | Status: DC | PRN
  Administered 2017-04-02: 10:00:00 via INTRAVENOUS

## 2017-04-02 MED ORDER — SODIUM CHLORIDE 0.9 % IV SOLN
INTRAVENOUS | Status: DC
  Administered 2017-04-02: 07:00:00 via INTRAVENOUS

## 2017-04-02 MED ORDER — FENTANYL CITRATE (PF) 100 MCG/2ML IJ SOLN
INTRAMUSCULAR | Status: AC
  Filled 2017-04-02: qty 2

## 2017-04-02 MED ORDER — MIDAZOLAM HCL 2 MG/2ML IJ SOLN
INTRAMUSCULAR | Status: DC | PRN
  Administered 2017-04-02: 2 mg via INTRAVENOUS

## 2017-04-02 MED ORDER — FENTANYL CITRATE (PF) 100 MCG/2ML IJ SOLN
INTRAMUSCULAR | Status: DC | PRN
Start: 2017-04-02 — End: 2017-04-02
  Administered 2017-04-02: 50 ug via INTRAVENOUS

## 2017-04-02 MED ORDER — FENTANYL CITRATE (PF) 100 MCG/2ML IJ SOLN
INTRAMUSCULAR | Status: DC | PRN
  Administered 2017-04-02: 50 ug via INTRAVENOUS

## 2017-04-02 MED ORDER — PROPOFOL 500 MG/50ML IV EMUL
INTRAVENOUS | Status: DC | PRN
  Administered 2017-04-02: 120 ug/kg/min via INTRAVENOUS

## 2017-04-02 NOTE — Anesthesia Procedure Notes (Signed)
Performed by: Cook-Martin, Rogene Meth Pre-anesthesia Checklist: Patient identified, Emergency Drugs available, Suction available, Patient being monitored and Timeout performed Patient Re-evaluated:Patient Re-evaluated prior to induction Oxygen Delivery Method: Nasal cannula Preoxygenation: Pre-oxygenation with 100% oxygen Induction Type: IV induction Placement Confirmation: CO2 detector and positive ETCO2       

## 2017-04-02 NOTE — H&P (Signed)
Primary Care Physician:  System, Pcp Not In Primary Gastroenterologist:  Dr. Mechele CollinElliott  Pre-Procedure History & Physical: HPI:  Joseph PriestRichard F Zimmers Sr. is a 71 y.o. male is here for an colonoscopy.   Past Medical History:  Diagnosis Date  . BPH (benign prostatic hyperplasia)   . Diabetes mellitus without complication (HCC)   . GERD (gastroesophageal reflux disease)   . History of kidney stones   . Hypertension   . Sleep apnea     Past Surgical History:  Procedure Laterality Date  . CHOLECYSTECTOMY    . nasal somnoplasty    . ROTATOR CUFF REPAIR Left     Prior to Admission medications   Medication Sig Start Date End Date Taking? Authorizing Provider  amLODipine (NORVASC) 10 MG tablet Take 10 mg by mouth daily.    [provider]  aspirin EC 81 MG tablet Take 81 mg by mouth daily.    [provider]  fenofibrate (TRICOR) 145 MG tablet Take 145 mg by mouth daily.    [provider]  fluticasone (FLONASE) 50 MCG/ACT nasal spray Place 2 sprays into both nostrils daily.    [provider]  furosemide (LASIX) 20 MG tablet Take 20 mg by mouth daily. As needed for up to 3 days for leg swelling    [provider]  gabapentin (NEURONTIN) 300 MG capsule Take 300 mg by mouth 2 (two) times daily.    [provider]  hydrochlorothiazide (HYDRODIURIL) 25 MG tablet Take 25 mg by mouth daily.    [provider]  insulin glargine (LANTUS) 100 UNIT/ML injection Inject 65 Units into the skin 2 (two) times daily.    [provider]  insulin lispro protamine-lispro (HUMALOG 50/50 MIX) (50-50) 100 UNIT/ML SUSP injection Inject into the skin 3 (three) times daily before meals. Take 20units before breakfast, 25units before lunch, and 35units before dinner    [provider]  lovastatin (MEVACOR) 20 MG tablet Take 20 mg by mouth at bedtime.    [provider]  metFORMIN (GLUCOPHAGE) 1000 MG tablet Take 1,000 mg by mouth  2 (two) times daily with a meal.    [provider]  ofloxacin (OCUFLOX) 0.3 % ophthalmic solution Place 2 drops into the left eye 2 (two) times daily.    [provider]  pantoprazole (PROTONIX) 40 MG tablet Take 40 mg by mouth daily.    [provider]  polyethylene glycol-electrolytes (NULYTELY/GOLYTELY) 420 g solution Take 4,000 mLs by mouth once.    [provider]  tamsulosin (FLOMAX) 0.4 MG CAPS capsule Take 1 capsule (0.4 mg total) by mouth daily after breakfast. Patient not taking: Reported on 04/02/2017 06/22/15   Emily FilbertWilliams, Jonathan E, MD  telmisartan (MICARDIS) 80 MG tablet Take 80 mg by mouth daily.    [provider]    Allergies as of 04/02/2017 - Review Complete 04/02/2017  Allergen Reaction Noted  . Metoprolol succinate [metoprolol]  04/01/2017    History reviewed. No pertinent family history.  Social History   Socioeconomic History  . Marital status: Married    Spouse name: Not on file  . Number of children: Not on file  . Years of education: Not on file  . Highest education level: Not on file  Social Needs  . Financial resource strain: Not on file  . Food insecurity - worry: Not on file  . Food insecurity - inability: Not on file  . Transportation needs - medical: Not on file  . Transportation  needs - non-medical: Not on file  Occupational History  . Not on file  Tobacco Use  . Smoking status: Never Smoker  . Smokeless tobacco: Never Used  Substance and Sexual Activity  . Alcohol use: No  . Drug use: No  . Sexual activity: Not on file  Other Topics Concern  . Not on file  Social History Narrative  . Not on file    Review of Systems: See HPI, otherwise negative ROS  Physical Exam: BP (!) 138/58   Pulse (!) 58   Temp (!) 97.3 F (36.3 C) (Tympanic)   Resp 17   SpO2 96%  General:   Alert,  pleasant and cooperative in NAD Head:  Normocephalic and atraumatic. Neck:  Supple; no masses or  thyromegaly. Lungs:  Clear throughout to auscultation.    Heart:  Regular rate and rhythm. Abdomen:  Soft, nontender and nondistended. Normal bowel sounds, without guarding, and without rebound.   Neurologic:  Alert and  oriented x4;  grossly normal neurologically.  Impression/Plan: Joseph Priestichard F Cuyler Sr. is here for an colonoscopy [facility]  Risks, benefits, limitations, and alternatives regarding  colonoscopy have been reviewed with the patient.  Questions have been answered.  All parties agreeable.   Lynnae PrudeELLIOTT, [name], MD  04/02/2017, 3:01 PM

## 2017-04-02 NOTE — Transfer of Care (Signed)
Immediate Anesthesia Transfer of Care Note  Patient: Joseph Music Sr.  Procedure(s) Performed: COLONOSCOPY (N/A )  Patient Location: PACU  Anesthesia Type:MAC  Level of Consciousness: awake, alert  and oriented  Airway & Oxygen Therapy: Patient Spontanous Breathing  Post-op Assessment: Report given to RN and Post -op Vital signs reviewed and stable  Post vital signs: Reviewed and stable  Last Vitals:  Vitals:   04/02/17 0706  BP: (!) 197/67  Pulse: 69  Resp: 20  Temp: (!) 36.3 C  SpO2: 98%    Last Pain:  Vitals:   04/02/17 0706  TempSrc: Tympanic         Complications: No apparent anesthesia complications

## 2017-04-02 NOTE — H&P (Signed)
Primary Care Physician:  System, Pcp Not In Primary Gastroenterologist:  Dr. Mechele CollinElliott  Pre-Procedure History & Physical: HPI:  Joseph PriestRichard F Ellerson Sr. is a [age] y.o. male is here for an colonoscopy.   Past Medical History:  Diagnosis Date  . BPH (benign prostatic hyperplasia)   . Diabetes mellitus without complication (HCC)   . GERD (gastroesophageal reflux disease)   . History of kidney stones   . Hypertension   . Sleep apnea     Past Surgical History:  Procedure Laterality Date  . CHOLECYSTECTOMY    . nasal somnoplasty    . ROTATOR CUFF REPAIR Left     Prior to Admission medications   Medication Sig Start Date End Date Taking? Authorizing Provider  amLODipine (NORVASC) 10 MG tablet Take 10 mg by mouth daily.   Yes [provider]  aspirin EC 81 MG tablet Take 81 mg by mouth daily.   Yes [provider]  fenofibrate (TRICOR) 145 MG tablet Take 145 mg by mouth daily.   Yes [provider]  fluticasone (FLONASE) 50 MCG/ACT nasal spray Place 2 sprays into both nostrils daily.   Yes [provider]  furosemide (LASIX) 20 MG tablet Take 20 mg by mouth daily. As needed for up to 3 days for leg swelling   Yes [provider]  gabapentin (NEURONTIN) 300 MG capsule Take 300 mg by mouth 2 (two) times daily.   Yes [provider]  hydrochlorothiazide (HYDRODIURIL) 25 MG tablet Take 25 mg by mouth daily.   Yes [provider]  insulin glargine (LANTUS) 100 UNIT/ML injection Inject 65 Units into the skin 2 (two) times daily.   Yes [provider]  insulin lispro protamine-lispro (HUMALOG 50/50 MIX) (50-50) 100 UNIT/ML SUSP injection Inject into the skin 3 (three) times daily before meals. Take 20units before breakfast, 25units before lunch, and 35units before dinner   Yes [provider]  lovastatin (MEVACOR) 20 MG tablet Take 20 mg by mouth at bedtime.   Yes [provider]  metFORMIN (GLUCOPHAGE) 1000 MG  tablet Take 1,000 mg by mouth 2 (two) times daily with a meal.   Yes [provider]  ofloxacin (OCUFLOX) 0.3 % ophthalmic solution Place 2 drops into the left eye 2 (two) times daily.   Yes [provider]  pantoprazole (PROTONIX) 40 MG tablet Take 40 mg by mouth daily.   Yes [provider]  polyethylene glycol-electrolytes (NULYTELY/GOLYTELY) 420 g solution Take 4,000 mLs by mouth once.   Yes [provider]  telmisartan (MICARDIS) 80 MG tablet Take 80 mg by mouth daily.   Yes [provider]  tamsulosin (FLOMAX) 0.4 MG CAPS capsule Take 1 capsule (0.4 mg total) by mouth daily after breakfast. Patient not taking: Reported on 04/02/2017 06/22/15   Emily FilbertWilliams, Jonathan E, MD    Allergies as of 01/31/2017  . (No Known Allergies)    History reviewed. No pertinent family history.  Social History   Socioeconomic History  . Marital status: Married    Spouse name: Not on file  . Number of children: Not on file  . Years of education: Not on file  . Highest education level: Not on file  Social Needs  . Financial resource strain: Not on file  . Food insecurity - worry: Not on file  . Food insecurity - inability: Not on file  . Transportation needs - medical: Not on file  . Transportation needs - non-medical: Not on file  Occupational History  .  Not on file  Tobacco Use  . Smoking status: Never Smoker  . Smokeless tobacco: Never Used  Substance and Sexual Activity  . Alcohol use: No  . Drug use: No  . Sexual activity: Not on file  Other Topics Concern  . Not on file  Social History Narrative  . Not on file    Review of Systems: See HPI, otherwise negative ROS  Physical Exam: BP (!) 179/62   Pulse 67   Temp 97.8 F (36.6 C) (Tympanic)   Resp (!) 21   Ht 5\' 9"  (1.753 m)   Wt 102.1 kg (225 lb)   SpO2 97%   BMI 33.23 kg/m  General:   Alert,  pleasant and cooperative in NAD Head:  Normocephalic and atraumatic. Neck:  Supple; no  masses or thyromegaly. Lungs:  Clear throughout to auscultation.    Heart:  Regular rate and rhythm. Abdomen:  Soft, nontender and nondistended. Normal bowel sounds, without guarding, and without rebound.   Neurologic:  Alert and  oriented x4;  grossly normal neurologically.  Impression/Plan: Joseph Priestichard F Marvin Sr. is here for an colon[facility]  Risks, benefits, limitations, and alternatives regarding  colonoscopy have been reviewed with the patient.  Questions have been answered.  All parties agreeable.   Lynnae PrudeELLIOTT, Rivers Gassmann, MD  04/02/2017, 10:54 AM

## 2017-04-02 NOTE — Anesthesia Postprocedure Evaluation (Signed)
Anesthesia Post Note  Patient: Joseph PriestRichard F Strout Sr.  Procedure(s) Performed: COLONOSCOPY WITH PROPOFOL (N/A )  Patient location during evaluation: Endoscopy Anesthesia Type: General Level of consciousness: awake and alert Pain management: pain level controlled Vital Signs Assessment: post-procedure vital signs reviewed and stable Respiratory status: spontaneous breathing, nonlabored ventilation, respiratory function stable and patient connected to nasal cannula oxygen Cardiovascular status: blood pressure returned to baseline and stable Postop Assessment: no apparent nausea or vomiting Anesthetic complications: no     Last Vitals:  Vitals:   04/02/17 1004  Temp: (!) 36.3 C    Last Pain:  Vitals:   04/02/17 1004  TempSrc: Tympanic  PainSc: Asleep                 Mickel Schreur S

## 2017-04-02 NOTE — OR Nursing (Signed)
Patient to post-op for cardiac evaluation. Cardiac clearance received. Patient discharged from this encounter and readmitted with new encounter in able to facilitate returning patient to endoscopy room for procedure. Care of patient was uninterrupted.

## 2017-04-02 NOTE — H&P (Deleted)
Primary Care Physician:  System, Pcp Not In Primary Gastroenterologist:  Dr. Mechele CollinElliott  Pre-Procedure History & Physical: HPI:  Joseph PriestRichard F Rode Sr. is a 71 y.o. male is here for an colonoscopy.   Past Medical History:  Diagnosis Date  . BPH (benign prostatic hyperplasia)   . Diabetes mellitus without complication (HCC)   . GERD (gastroesophageal reflux disease)   . History of kidney stones   . Hypertension   . Sleep apnea     Past Surgical History:  Procedure Laterality Date  . CHOLECYSTECTOMY    . nasal somnoplasty    . ROTATOR CUFF REPAIR Left     Prior to Admission medications   Medication Sig Start Date End Date Taking? Authorizing Provider  amLODipine (NORVASC) 10 MG tablet Take 10 mg by mouth daily.   Yes [provider]  aspirin EC 81 MG tablet Take 81 mg by mouth daily.   Yes [provider]  fenofibrate (TRICOR) 145 MG tablet Take 145 mg by mouth daily.   Yes [provider]  fluticasone (FLONASE) 50 MCG/ACT nasal spray Place 2 sprays into both nostrils daily.   Yes [provider]  furosemide (LASIX) 20 MG tablet Take 20 mg by mouth daily. As needed for up to 3 days for leg swelling   Yes [provider]  gabapentin (NEURONTIN) 300 MG capsule Take 300 mg by mouth 2 (two) times daily.   Yes [provider]  hydrochlorothiazide (HYDRODIURIL) 25 MG tablet Take 25 mg by mouth daily.   Yes [provider]  insulin glargine (LANTUS) 100 UNIT/ML injection Inject 65 Units into the skin 2 (two) times daily.   Yes [provider]  insulin lispro protamine-lispro (HUMALOG 50/50 MIX) (50-50) 100 UNIT/ML SUSP injection Inject into the skin 3 (three) times daily before meals. Take 20units before breakfast, 25units before lunch, and 35units before dinner   Yes [provider]  lovastatin (MEVACOR) 20 MG tablet Take 20 mg by mouth at bedtime.   Yes [provider]  metFORMIN (GLUCOPHAGE) 1000 MG  tablet Take 1,000 mg by mouth 2 (two) times daily with a meal.   Yes [provider]  ofloxacin (OCUFLOX) 0.3 % ophthalmic solution Place 2 drops into the left eye 2 (two) times daily.   Yes [provider]  pantoprazole (PROTONIX) 40 MG tablet Take 40 mg by mouth daily.   Yes [provider]  polyethylene glycol-electrolytes (NULYTELY/GOLYTELY) 420 g solution Take 4,000 mLs by mouth once.   Yes [provider]  telmisartan (MICARDIS) 80 MG tablet Take 80 mg by mouth daily.   Yes [provider]  tamsulosin (FLOMAX) 0.4 MG CAPS capsule Take 1 capsule (0.4 mg total) by mouth daily after breakfast. Patient not taking: Reported on 04/02/2017 06/22/15   Emily FilbertWilliams, Jonathan E, MD    Allergies as of 01/31/2017  . (No Known Allergies)    History reviewed. No pertinent family history.  Social History   Socioeconomic History  . Marital status: Married    Spouse name: Not on file  . Number of children: Not on file  . Years of education: Not on file  . Highest education level: Not on file  Social Needs  . Financial resource strain: Not on file  . Food insecurity - worry: Not on file  . Food insecurity - inability: Not on file  . Transportation needs - medical: Not on file  . Transportation needs - non-medical: Not on file  Occupational History  .  Not on file  Tobacco Use  . Smoking status: Never Smoker  . Smokeless tobacco: Never Used  Substance and Sexual Activity  . Alcohol use: No  . Drug use: No  . Sexual activity: Not on file  Other Topics Concern  . Not on file  Social History Narrative  . Not on file    Review of Systems: See HPI, otherwise negative ROS  Physical Exam: BP (!) 197/67   Pulse 69   Temp (!) 97.3 F (36.3 C) (Tympanic)   Resp 20   Ht 5\' 9"  (1.753 m)   Wt 102.1 kg (225 lb)   SpO2 98%   BMI 33.23 kg/m  General:   Alert,  pleasant and cooperative in NAD Head:  Normocephalic and atraumatic. Neck:  Supple; no  masses or thyromegaly. Lungs:  Clear throughout to auscultation.    Heart:  Regular rate and rhythm. Abdomen:  Soft, nontender and nondistended. Normal bowel sounds, without guarding, and without rebound.   Neurologic:  Alert and  oriented x4;  grossly normal neurologically.  Impression/Plan: Joseph Hensley Sr. is here for an colonoscopy to be performed for [facility] colon polyps.  Risks, benefits, limitations, and alternatives regarding  colonoscopy have been reviewed with the patient.  Questions have been answered.  All parties agreeable.   Lynnae Prude, MD  04/02/2017, 7:29 AM   Primary Care Physician:  System, Pcp Not In Primary Gastroenterologist:  Dr. Mechele Collin  Pre-Procedure History & Physical: HPI:  Joseph SIMIEN Sr. is a 71 y.o. male is here for an colonoscopy.   Past Medical History:  Diagnosis Date  . BPH (benign prostatic hyperplasia)   . Diabetes mellitus without complication (HCC)   . GERD (gastroesophageal reflux disease)   . History of kidney stones   . Hypertension   . Sleep apnea     Past Surgical History:  Procedure Laterality Date  . CHOLECYSTECTOMY    . nasal somnoplasty    . ROTATOR CUFF REPAIR Left     Prior to Admission medications   Medication Sig Start Date End Date Taking? Authorizing Provider  amLODipine (NORVASC) 10 MG tablet Take 10 mg by mouth daily.   Yes [provider]  aspirin EC 81 MG tablet Take 81 mg by mouth daily.   Yes [provider]  fenofibrate (TRICOR) 145 MG tablet Take 145 mg by mouth daily.   Yes [provider]  fluticasone (FLONASE) 50 MCG/ACT nasal spray Place 2 sprays into both nostrils daily.   Yes [provider]  furosemide (LASIX) 20 MG tablet Take 20 mg by mouth daily. As needed for up to 3 days for leg swelling   Yes [provider]  gabapentin (NEURONTIN) 300 MG capsule Take 300 mg by mouth 2 (two) times daily.   Yes [provider]  hydrochlorothiazide  (HYDRODIURIL) 25 MG tablet Take 25 mg by mouth daily.   Yes [provider]  insulin glargine (LANTUS) 100 UNIT/ML injection Inject 65 Units into the skin 2 (two) times daily.   Yes [provider]  insulin lispro protamine-lispro (HUMALOG 50/50 MIX) (50-50) 100 UNIT/ML SUSP injection Inject into the skin 3 (three) times daily before meals. Take 20units before breakfast, 25units before lunch, and 35units before dinner   Yes [provider]  lovastatin (MEVACOR) 20 MG tablet Take 20 mg by mouth at bedtime.   Yes [provider]  metFORMIN (GLUCOPHAGE) 1000 MG tablet Take 1,000 mg by mouth 2 (two) times daily with a  meal.   Yes [provider]  ofloxacin (OCUFLOX) 0.3 % ophthalmic solution Place 2 drops into the left eye 2 (two) times daily.   Yes [provider]  pantoprazole (PROTONIX) 40 MG tablet Take 40 mg by mouth daily.   Yes [provider]  polyethylene glycol-electrolytes (NULYTELY/GOLYTELY) 420 g solution Take 4,000 mLs by mouth once.   Yes [provider]  telmisartan (MICARDIS) 80 MG tablet Take 80 mg by mouth daily.   Yes [provider]  tamsulosin (FLOMAX) 0.4 MG CAPS capsule Take 1 capsule (0.4 mg total) by mouth daily after breakfast. Patient not taking: Reported on 04/02/2017 06/22/15   Emily FilbertWilliams, Jonathan E, MD    Allergies as of 01/31/2017  . (No Known Allergies)    History reviewed. No pertinent family history.  Social History   Socioeconomic History  . Marital status: Married    Spouse name: Not on file  . Number of children: Not on file  . Years of education: Not on file  . Highest education level: Not on file  Social Needs  . Financial resource strain: Not on file  . Food insecurity - worry: Not on file  . Food insecurity - inability: Not on file  . Transportation needs - medical: Not on file  . Transportation needs - non-medical: Not on file  Occupational History  . Not on file   Tobacco Use  . Smoking status: Never Smoker  . Smokeless tobacco: Never Used  Substance and Sexual Activity  . Alcohol use: No  . Drug use: No  . Sexual activity: Not on file  Other Topics Concern  . Not on file  Social History Narrative  . Not on file    Review of Systems: See HPI, otherwise negative ROS  Physical Exam: BP (!) 197/67   Pulse 69   Temp (!) 97.3 F (36.3 C) (Tympanic)   Resp 20   Ht 5\' 9"  (1.753 m)   Wt 102.1 kg (225 lb)   SpO2 98%   BMI 33.23 kg/m  General:   Alert,  pleasant and cooperative in NAD Head:  Normocephalic and atraumatic. Neck:  Supple; no masses or thyromegaly. Lungs:  Clear throughout to auscultation.    Heart:  Regular rate and rhythm. Abdomen:  Soft, nontender and nondistended. Normal bowel sounds, without guarding, and without rebound.   Neurologic:  Alert and  oriented x4;  grossly normal neurologically.  Impression/Plan: Joseph Priestichard F Wee Sr. is here for an colonoscopy [facility]  Risks, benefits, limitations, and alternatives regarding  colonoscopy have been reviewed with the patient.  Questions have been answered.  All parties agreeable.   Lynnae PrudeELLIOTT, Gilda Abboud, MD  04/02/2017, 7:29 AM

## 2017-04-02 NOTE — OR Nursing (Signed)
Patient readmitted for return to procedure room. Care not interrupted.

## 2017-04-02 NOTE — Anesthesia Post-op Follow-up Note (Signed)
Anesthesia QCDR form completed.        

## 2017-04-02 NOTE — Anesthesia Preprocedure Evaluation (Signed)
Anesthesia Evaluation  Patient identified by MRN, date of birth, ID band Patient awake    Reviewed: Allergy & Precautions, NPO status , Patient's Chart, lab work & pertinent test results, reviewed documented beta blocker date and time   Airway Mallampati: II  TM Distance: >3 FB     Dental  (+) Chipped   Pulmonary sleep apnea ,           Cardiovascular hypertension, Pt. on medications      Neuro/Psych    GI/Hepatic GERD  Medicated,  Endo/Other  diabetes, Type 2  Renal/GU      Musculoskeletal   Abdominal   Peds  Hematology   Anesthesia Other Findings   Reproductive/Obstetrics                             Anesthesia Physical Anesthesia Plan  ASA: III  Anesthesia Plan: General   Post-op Pain Management:    Induction: Intravenous  PONV Risk Score and Plan:   Airway Management Planned:   Additional Equipment:   Intra-op Plan:   Post-operative Plan:   Informed Consent: I have reviewed the patients History and Physical, chart, labs and discussed the procedure including the risks, benefits and alternatives for the proposed anesthesia with the patient or authorized representative who has indicated his/her understanding and acceptance.     Plan Discussed with: CRNA  Anesthesia Plan Comments:         Anesthesia Quick Evaluation

## 2017-04-02 NOTE — Transfer of Care (Signed)
Immediate Anesthesia Transfer of Care Note  Patient: Joseph PriestRichard F Jacuinde Sr.  Procedure(s) Performed: COLONOSCOPY WITH PROPOFOL (N/A )  Patient Location: PACU  Anesthesia Type:General  Level of Consciousness: awake and sedated  Airway & Oxygen Therapy: Patient Spontanous Breathing and Patient connected to nasal cannula oxygen  Post-op Assessment: Report given to RN and Post -op Vital signs reviewed and stable  Post vital signs: Reviewed and stable  Last Vitals: There were no vitals filed for this visit.  Last Pain: There were no vitals filed for this visit.       Complications: No apparent anesthesia complications

## 2017-04-02 NOTE — Consult Note (Signed)
[facility]  CARDIOLOGY CONSULT NOTE  Patient ID: Joseph Priestichard F Hilleary Sr. MRN: [id number] DOB/AGE: Feb 16, 1946 71 y.o.  Admit date: 04/02/2017 Referring Physician Dr. Markham JordanElliot Primary Physician [facility] Cardiologist None per patient Reason for Consultation New onset atrial fibrillation prior to colonoscopy  HPI: 71 year old male referred for evaluation of apparent new-onset atrial fibrillation prior to colonoscopy. The patient has a history of CAD, status post diagnostic cardiac catheterization about 10 years ago, OSA, obesity, hypertriglyceridemia, hypertension, and type II diabetes. The patient was in his usual state of health this morning, prepared to proceed with scheduled elective colonoscopy. ECG revealed atrial fibrillation at rate of 71 bpm. The patient denies experiencing palpitations, heart racing, chest pain, or current shortness of breath. He does report a 2-3 month history of progressive shortness of breath, but denies chest pain. Of note, the patient underwent a nuclear stress test 04/25/2016, which revealed normal LV function with LVEF 63%, with normal wall motion, without evidence of scar or ischemia.   Review of systems complete and found to be negative unless listed above     Past Medical History:  Diagnosis Date  . BPH (benign prostatic hyperplasia)   . Diabetes mellitus without complication (HCC)   . GERD (gastroesophageal reflux disease)   . History of kidney stones   . Hypertension   . Sleep apnea     Past Surgical History:  Procedure Laterality Date  . CHOLECYSTECTOMY    . nasal somnoplasty    . ROTATOR CUFF REPAIR Left     Medications Prior to Admission  Medication Sig Dispense Refill Last Dose  . amLODipine (NORVASC) 10 MG tablet Take 10 mg by mouth daily.   03/31/2017  . aspirin EC 81 MG tablet Take 81 mg by mouth daily.   03/31/2017  . fenofibrate (TRICOR) 145 MG tablet Take 145 mg by mouth daily.   03/31/2017  . fluticasone (FLONASE) 50 MCG/ACT  nasal spray Place 2 sprays into both nostrils daily.     . furosemide (LASIX) 20 MG tablet Take 20 mg by mouth daily. As needed for up to 3 days for leg swelling   Past Week at Unknown time  . gabapentin (NEURONTIN) 300 MG capsule Take 300 mg by mouth 2 (two) times daily.   03/31/2017  . hydrochlorothiazide (HYDRODIURIL) 25 MG tablet Take 25 mg by mouth daily.   03/31/2017  . insulin glargine (LANTUS) 100 UNIT/ML injection Inject 65 Units into the skin 2 (two) times daily.   03/31/2017  . insulin lispro protamine-lispro (HUMALOG 50/50 MIX) (50-50) 100 UNIT/ML SUSP injection Inject into the skin 3 (three) times daily before meals. Take 20units before breakfast, 25units before lunch, and 35units before dinner   03/31/2017  . lovastatin (MEVACOR) 20 MG tablet Take 20 mg by mouth at bedtime.   03/31/2017  . metFORMIN (GLUCOPHAGE) 1000 MG tablet Take 1,000 mg by mouth 2 (two) times daily with a meal.   03/31/2017  . ofloxacin (OCUFLOX) 0.3 % ophthalmic solution Place 2 drops into the left eye 2 (two) times daily.   Past Month at Unknown time  . pantoprazole (PROTONIX) 40 MG tablet Take 40 mg by mouth daily.   03/31/2017  . polyethylene glycol-electrolytes (NULYTELY/GOLYTELY) 420 g solution Take 4,000 mLs by mouth once.     Marland Kitchen. telmisartan (MICARDIS) 80 MG tablet Take 80 mg by mouth daily.   03/31/2017  . tamsulosin (FLOMAX) 0.4 MG CAPS capsule Take 1 capsule (0.4 mg total) by mouth daily after breakfast. (Patient not taking: Reported  on 04/02/2017) 30 capsule 0 Not Taking at Unknown time   Social History   Socioeconomic History  . Marital status: Married    Spouse name: Not on file  . Number of children: Not on file  . Years of education: Not on file  . Highest education level: Not on file  Social Needs  . Financial resource strain: Not on file  . Food insecurity - worry: Not on file  . Food insecurity - inability: Not on file  . Transportation needs - medical: Not on file  . Transportation needs  - non-medical: Not on file  Occupational History  . Not on file  Tobacco Use  . Smoking status: Never Smoker  . Smokeless tobacco: Never Used  Substance and Sexual Activity  . Alcohol use: No  . Drug use: No  . Sexual activity: Not on file  Other Topics Concern  . Not on file  Social History Narrative  . Not on file    History reviewed. No pertinent family history.    Review of systems complete and found to be negative unless listed above      PHYSICAL EXAM  General: Well developed, well nourished, in no acute distress HEENT:  Normocephalic and atramatic Neck: Supple Lungs: Normal effort of breathing on suppplemental oxygen, no wheezing, no assessory muscle use Heart: Irregularly irregular. 1-2/6 systolic murmur  Abdomen: abdomen soft and non-tender  Msk:  Patient lying at an incline in bed. Normal strength and tone for age. Extremities: No clubbing, cyanosis or edema.   Neuro: Alert and oriented X 3. Psych:  Good affect, responds appropriately  Labs:   Lab Results  Component Value Date   WBC 10.1 06/22/2015   HGB 15.0 06/22/2015   HCT 42.6 06/22/2015   MCV 83.6 06/22/2015   PLT 192 06/22/2015   No results for input(s): NA, K, CL, CO2, BUN, CREATININE, CALCIUM, PROT, BILITOT, ALKPHOS, ALT, AST, GLUCOSE in the last 168 hours.  Invalid input(s): LABALBU No results found for: CKTOTAL, CKMB, CKMBINDEX, TROPONINI No results found for: CHOL No results found for: HDL No results found for: LDLCALC No results found for: TRIG No results found for: CHOLHDL No results found for: LDLDIRECT    Radiology: No results found.  EKG: atrial fibrillation, 71 bpm  ASSESSMENT AND PLAN:  1. New onset atrial fibrillation, rate controlled, apparently asymptomatic.  2. CAD, status post nuclear stress test 04/2016, which revealed LVEF 63%, no evidence of scar or ischemia; patient without recent chest pain.  Recommendations: 1. Per Dr. Darrold JunkerParaschos, proceed with scheduled colonoscopy  this morning. 2. Patient to follow[facility] as [facility] next week for further evaluation of atrial fibrillation and discussion of anticoagulation. 3. 2D echocardiogram as outpatient.  Signed: Leanora Ivanoffnna Taray Normoyle PA-C 04/02/2017, 8:32 AM

## 2017-04-02 NOTE — Anesthesia Preprocedure Evaluation (Signed)
Anesthesia Evaluation   Patient awake    Reviewed: Allergy & Precautions, NPO status , Patient's Chart, lab work & pertinent test results  Airway Mallampati: III       Dental   Pulmonary sleep apnea ,           Cardiovascular hypertension,      Neuro/Psych    GI/Hepatic GERD  ,  Endo/Other  diabetes, Type 2  Renal/GU      Musculoskeletal   Abdominal   Peds  Hematology   Anesthesia Other Findings Obese.  Reproductive/Obstetrics                            Anesthesia Physical Anesthesia Plan  ASA: III  Anesthesia Plan:    Post-op Pain Management: GA combined w/ Regional for post-op pain   Induction: Intravenous  PONV Risk Score and Plan:   Airway Management Planned:   Additional Equipment:   Intra-op Plan:   Post-operative Plan:   Informed Consent:   Plan Discussed with:   Anesthesia Plan Comments:         Anesthesia Quick Evaluation

## 2017-04-02 NOTE — Op Note (Signed)
[facility] Patient Name: Joseph DikeRichard Kielty Procedure Date: 04/02/2017 9:41 AM MRN: [id number] Account #: [id number] Date of Birth: [birth year] Admit Type: Outpatient Age: [id number] [facility] Gender: Male Note Status: Finalized Procedure:            Colonoscopy Indications:          High risk colon cancer surveillance: Personal history                        of colonic polyps Providers:            Scot Junobert T. Elliott, MD Referring MD:         No Local Md, MD (Referring MD) Medicines:            Propofol per Anesthesia Complications:        No immediate complications. Procedure:            Pre-Anesthesia Assessment:                       - After reviewing the risks and benefits, the patient                        was deemed in satisfactory condition to undergo the                        procedure.                       After obtaining informed consent, the colonoscope was                        passed under direct vision. Throughout the procedure,                        the patient's blood pressure, pulse, and oxygen                        saturations were monitored continuously. The                        Colonoscope was introduced through the anus and                        advanced to the the cecum, identified by appendiceal                        orifice and ileocecal valve. The colonoscopy was                        performed without difficulty. The patient tolerated the                        procedure well. The quality of the bowel preparation                        was excellent. Findings:      A few medium-mouthed diverticula were found in the sigmoid colon.      Internal hemorrhoids were found during endoscopy. The hemorrhoids were       small and Grade I (internal hemorrhoids that do not prolapse).      The exam was  otherwise without abnormality. Impression:           - Diverticulosis in the sigmoid colon.                       -  Internal hemorrhoids.                       - The examination was otherwise normal.                       - No specimens collected. Recommendation:       - Repeat colonoscopy in 5 years for surveillance. Scot Junobert T Elliott, MD 04/02/2017 10:01:28 AM This report has been signed electronically. Number of Addenda: 0 Note Initiated On: 04/02/2017 9:41 AM Scope Withdrawal Time: 0 hours 6 minutes 32 seconds  Total Procedure Duration: 0 hours 11 minutes 21 seconds       [facility]

## 2017-04-02 NOTE — Anesthesia Postprocedure Evaluation (Signed)
Anesthesia Post Note  Patient: Joseph PriestRichard F Hulett Sr.  Procedure(s) Performed: COLONOSCOPY (N/A )  Patient location during evaluation: Endoscopy Anesthesia Type: General Level of consciousness: awake and alert Pain management: pain level controlled Vital Signs Assessment: post-procedure vital signs reviewed and stable Respiratory status: spontaneous breathing, nonlabored ventilation, respiratory function stable and patient connected to nasal cannula oxygen Cardiovascular status: blood pressure returned to baseline and stable Postop Assessment: no apparent nausea or vomiting Anesthetic complications: no     Last Vitals:  Vitals:   04/02/17 0820 04/02/17 0830  BP: (!) 190/54 (!) 179/62  Pulse: 82 67  Resp: 14 (!) 21  Temp:    SpO2: 98% 97%    Last Pain:  Vitals:   04/02/17 0800  TempSrc: Tympanic                 Keiaira Donlan S

## 2017-04-07 ENCOUNTER — Encounter: Payer: Self-pay | Admitting: Unknown Physician Specialty

## 2017-04-18 ENCOUNTER — Other Ambulatory Visit: Payer: Self-pay | Admitting: Nurse Practitioner

## 2017-04-18 DIAGNOSIS — R1011 Right upper quadrant pain: Secondary | ICD-10-CM

## 2017-05-02 ENCOUNTER — Ambulatory Visit: Payer: Medicare Other

## 2017-05-02 ENCOUNTER — Ambulatory Visit
Admission: RE | Admit: 2017-05-02 | Discharge: 2017-05-02 | Disposition: A | Discharge status: Home / Self Care | Payer: Medicare Other | Source: Ambulatory Visit | Attending: Nurse Practitioner | Admitting: Nurse Practitioner

## 2017-05-02 DIAGNOSIS — R1011 Right upper quadrant pain: Secondary | ICD-10-CM | POA: Diagnosis not present

## 2017-05-02 DIAGNOSIS — R162 Hepatomegaly with splenomegaly, not elsewhere classified: Secondary | ICD-10-CM | POA: Diagnosis not present

## 2021-05-09 ENCOUNTER — Ambulatory Visit
Admission: EM | Admit: 2021-05-09 | Discharge: 2021-05-09 | Disposition: A | Discharge status: Home / Self Care | Payer: Medicare Other | Attending: Physician Assistant | Admitting: Physician Assistant

## 2021-05-09 ENCOUNTER — Other Ambulatory Visit: Payer: Self-pay

## 2021-05-09 DIAGNOSIS — J019 Acute sinusitis, unspecified: Secondary | ICD-10-CM | POA: Diagnosis present

## 2021-05-09 DIAGNOSIS — R0981 Nasal congestion: Secondary | ICD-10-CM | POA: Insufficient documentation

## 2021-05-09 DIAGNOSIS — R051 Acute cough: Secondary | ICD-10-CM | POA: Diagnosis present

## 2021-05-09 DIAGNOSIS — E162 Hypoglycemia, unspecified: Secondary | ICD-10-CM | POA: Insufficient documentation

## 2021-05-09 LAB — GLUCOSE, CAPILLARY
Glucose-Capillary: 47 mg/dL — ABNORMAL LOW (ref 70–99)
Glucose-Capillary: 65 mg/dL — ABNORMAL LOW (ref 70–99)

## 2021-05-09 MED ORDER — GLUCOSE 4 G PO CHEW
1.0000 | CHEWABLE_TABLET | Freq: Once | ORAL | Status: AC
  Administered 2021-05-09: 11:00:00 4 g via ORAL

## 2021-05-09 MED ORDER — AMOXICILLIN-POT CLAVULANATE 875-125 MG PO TABS: 1.0000 | ORAL_TABLET | Freq: Two times a day (BID) | ORAL | 0 refills | Status: AC

## 2021-05-09 MED ORDER — IPRATROPIUM BROMIDE 0.06 % NA SOLN: 2.0000 | Freq: Four times a day (QID) | NASAL | 0 refills | Status: DC

## 2021-05-09 NOTE — ED Triage Notes (Signed)
Pt offered protein bar or protein shake to assist with glucose.  Pt does not want either.  Wife at bedside and advised to notify nurse/pull call bell if assistance is needed.

## 2021-05-09 NOTE — Discharge Instructions (Addendum)
-  I have sent antibiotics to the pharmacy for your sinus infection as well as a nasal spray. - Increase rest and fluids. - Coricidin HBP if needed for cough.  Counseled use nasal saline. - We did give you 2 glucose tablets in clinic since your blood sugar was low.  Go ahead and have some lunch unsure will come up some more. - You cannot get your blood sugar under control comminuted at the ED.

## 2021-05-09 NOTE — ED Provider Notes (Signed)
[facility]    CSN: [id number] Arrival date & time: 05/09/21  0820      History   Chief Complaint Chief Complaint  Patient presents with   Nasal Congestion    HPI Joseph Hensley. is a 75 y.o. male presenting for 3-week history of fatigue, nasal congestion, sinus pressure and productive cough.  Patient reports blowing his nose so often that he has noticed blood-streaked rhinorrhea.  Mild headaches.  No chest pain or breathing difficulty.  Symptoms not getting any better or worse from onset.  No known COVID or flu exposure.  Has taken Coricidin HBP over-the-counter for cough occasionally.  Reports concern for sinus infection.  No other complaints.  History of diabetes, hypertension, sleep apnea.  HPI  Past Medical History:  Diagnosis Date   BPH (benign prostatic hyperplasia)    Diabetes mellitus without complication (HCC)    GERD (gastroesophageal reflux disease)    History of kidney stones    Hypertension    Sleep apnea     There are no problems to display for this patient.   Past Surgical History:  Procedure Laterality Date   CHOLECYSTECTOMY     COLONOSCOPY N/A 04/02/2017   Procedure: COLONOSCOPY;  Surgeon: Scot Jun, MD;  Location: [facility] ENDOSCOPY;  Service: Endoscopy;  Laterality: N/A;   COLONOSCOPY WITH PROPOFOL N/A 04/02/2017   Procedure: COLONOSCOPY WITH PROPOFOL;  Surgeon: Scot Jun, MD;  Location: [facility] ENDOSCOPY;  Service: Endoscopy;  Laterality: N/A;   nasal somnoplasty     ROTATOR CUFF REPAIR Left        Home Medications    Prior to Admission medications   Medication Sig Start Date End Date Taking? Authorizing Provider  amLODipine (NORVASC) 10 MG tablet Take 10 mg by mouth daily.   Yes [provider]  amoxicillin-clavulanate (AUGMENTIN) 875-125 MG tablet Take 1 tablet by mouth every 12 (twelve) hours for 7 days. 05/09/21 05/16/21 Yes Shirlee Latch, PA-C  aspirin EC 81 MG tablet Take 81 mg by mouth daily.   Yes  [provider]  fenofibrate (TRICOR) 145 MG tablet Take 145 mg by mouth daily.   Yes [provider]  fluticasone (FLONASE) 50 MCG/ACT nasal spray Place 2 sprays into both nostrils daily.   Yes [provider]  furosemide (LASIX) 20 MG tablet Take 20 mg by mouth daily. As needed for up to 3 days for leg swelling   Yes [provider]  gabapentin (NEURONTIN) 300 MG capsule Take 300 mg by mouth 2 (two) times daily.   Yes [provider]  hydrochlorothiazide (HYDRODIURIL) 25 MG tablet Take 25 mg by mouth daily.   Yes [provider]  insulin glargine (LANTUS) 100 UNIT/ML injection Inject 65 Units into the skin 2 (two) times daily.   Yes [provider]  insulin lispro protamine-lispro (HUMALOG 50/50 MIX) (50-50) 100 UNIT/ML SUSP injection Inject into the skin 3 (three) times daily before meals. Take 20units before breakfast, 25units before lunch, and 35units before dinner   Yes [provider]  ipratropium (ATROVENT) 0.06 % nasal spray Place 2 sprays into both nostrils 4 (four) times daily. 05/09/21  Yes Shirlee Latch, PA-C  lovastatin (MEVACOR) 20 MG tablet Take 20 mg by mouth at bedtime.   Yes [provider]  metFORMIN (GLUCOPHAGE) 1000 MG tablet Take 1,000 mg by mouth 2 (two) times daily with a meal.   Yes [provider]  ofloxacin (OCUFLOX) 0.3 % ophthalmic solution Place 2 drops  into the left eye 2 (two) times daily.   Yes [provider]  pantoprazole (PROTONIX) 40 MG tablet Take 40 mg by mouth daily.   Yes [provider]  polyethylene glycol-electrolytes (NULYTELY/GOLYTELY) 420 g solution Take 4,000 mLs by mouth once.   Yes [provider]  tamsulosin (FLOMAX) 0.4 MG CAPS capsule Take 1 capsule (0.4 mg total) by mouth daily after breakfast. 06/22/15  Yes Emily Filbert, MD  telmisartan (MICARDIS) 80 MG tablet Take 80 mg by mouth daily.   Yes [provider]     Family History No family history on file.  Social History Social History   Tobacco Use   Smoking status: Never   Smokeless tobacco: Never  Vaping Use   Vaping Use: Never used  Substance Use Topics   Alcohol use: No   Drug use: No     Allergies   Metoprolol succinate [metoprolol]   Review of Systems Review of Systems  Constitutional:  Positive for fatigue. Negative for fever.  HENT:  Positive for congestion, rhinorrhea and sinus pressure. Negative for sore throat.   Respiratory:  Positive for cough. Negative for shortness of breath.   Gastrointestinal:  Negative for abdominal pain, diarrhea, nausea and vomiting.  Musculoskeletal:  Negative for myalgias.  Neurological:  Negative for weakness, light-headedness and headaches.  Hematological:  Negative for adenopathy.    Physical Exam Triage Vital Signs ED Triage Vitals  Enc Vitals Group     BP 05/09/21 0925 (!) 149/55     Pulse Rate 05/09/21 0925 87     Resp 05/09/21 0925 18     Temp 05/09/21 0925 98.4 F (36.9 C)     Temp Source 05/09/21 0925 Oral     SpO2 05/09/21 0925 98 %     Weight 05/09/21 0928 250 lb (113.4 kg)     Height --      Head Circumference --      Peak Flow --      Pain Score 05/09/21 0925 0     Pain Loc --      Pain Edu? --      Excl. in GC? --    No data found.  Updated Vital Signs BP (!) 149/55 (BP Location: Left Arm)    Pulse 87    Temp 98.4 F (36.9 C) (Oral)    Resp 18    Wt 250 lb (113.4 kg)    SpO2 98%    BMI 36.92 kg/m      Physical Exam Vitals and nursing note reviewed.  Constitutional:      General: He is not in acute distress.    Appearance: Normal appearance. He is well-developed. He is obese. He is ill-appearing.  HENT:     Head: Normocephalic and atraumatic.     Right Ear: Tympanic membrane, ear canal and external ear normal.     Left Ear: Tympanic membrane, ear canal and external ear normal.     Nose: Congestion present.     Mouth/Throat:     Mouth: Mucous  membranes are moist.     Pharynx: Oropharynx is clear. Posterior oropharyngeal erythema present.  Eyes:     General: No scleral icterus.    Conjunctiva/sclera: Conjunctivae normal.  Cardiovascular:     Rate and Rhythm: Normal rate and regular rhythm.     Heart sounds: Normal heart sounds.  Pulmonary:     Effort: Pulmonary effort is normal. No respiratory distress.     Breath sounds: Normal breath sounds.  Musculoskeletal:     Cervical back: Neck supple.  Skin:    General: Skin is warm and dry.     Capillary Refill: Capillary refill takes less than 2 seconds.  Neurological:     General: No focal deficit present.     Mental Status: He is alert. Mental status is at baseline.     Motor: No weakness.     Coordination: Coordination normal.     Gait: Gait normal.  Psychiatric:        Mood and Affect: Mood normal.        Behavior: Behavior normal.        Thought Content: Thought content normal.     UC Treatments / Results  Labs (all labs ordered are listed, but only abnormal results are displayed) Labs Reviewed  GLUCOSE, CAPILLARY - Abnormal; Notable for the following components:      Result Value   Glucose-Capillary 47 (*)    All other components within normal limits  GLUCOSE, CAPILLARY - Abnormal; Notable for the following components:   Glucose-Capillary 65 (*)    All other components within normal limits    EKG   Radiology No results found.  Procedures Procedures (including critical care time)  Medications Ordered in UC Medications  glucose chewable tablet 4 g (4 g Oral Given 05/09/21 1100)  glucose chewable tablet 4 g (4 g Oral Given 05/09/21 1125)    Initial Impression / Assessment and Plan / UC Course  I have reviewed the triage vital signs and the nursing notes.  Pertinent labs & imaging results that were available during my care of the patient were reviewed by me and considered in my medical decision making (see chart for details).  75 year old male  presenting for 3-week history of nasal congestion, sinus pressure and cough.  Patient did have to wait for about 3 hours before being seen by provider.  Unfortunately, I was the only provider at the clinic today and wait times were long.  Patient reported to nursing staff that he was feeling shaky and like his blood sugar was low since he had not eaten.  Blood glucose checked at 47.  Patient given chewable glucose tablet.  15 minutes later recheck was 65 and patient reported feeling a bit better.  Patient given another glucose tablet.  He was also offered a protein shake or protein bar but declined at stating that he plans to go to [employer] after he is discharged.  Vitals are stable.  He is afebrile.  He is mildly ill-appearing.  Nasal congestion on exam and light yellowish drainage.  Posterior pharyngeal erythema.  Chest clear to auscultation heart regular rate rhythm.  Patient symptoms suspicious for acute sinusitis.  Will treat with Augmentin.  Also sent Atrovent nasal spray to pharmacy.  Discussed increasing rest and fluid intake.  Discussed keeping an eye on his blood sugar and if it is not coming up he needs to go to ED.  However, it showed especially if he is going to have lunch now.  Patient leaving in stable condition.   Final Clinical Impressions(s) / UC Diagnoses   Final diagnoses:  Acute sinusitis, recurrence not specified, unspecified location  Acute cough  Nasal congestion  Hypoglycemia     Discharge Instructions      -I have sent antibiotics to the pharmacy for your sinus infection as well as a nasal spray. - Increase rest and fluids. - Coricidin HBP if needed for cough.  Counseled use nasal saline. - We  did give you 2 glucose tablets in clinic since your blood sugar was low.  Go ahead and have some lunch unsure will come up some more. - You cannot get your blood sugar under control comminuted at the ED.     ED Prescriptions     Medication Sig Dispense Auth.  Provider   amoxicillin-clavulanate (AUGMENTIN) 875-125 MG tablet Take 1 tablet by mouth every 12 (twelve) hours for 7 days. 14 tablet Eusebio Friendly B, PA-C   ipratropium (ATROVENT) 0.06 % nasal spray Place 2 sprays into both nostrils 4 (four) times daily. 15 mL Shirlee Latch, PA-C      PDMP not reviewed this encounter.   Shirlee Latch, PA-C 05/09/21 1154

## 2021-05-09 NOTE — ED Triage Notes (Signed)
Patient is here for "Congestion". Concerned with "Sinus infection" with "Cough" for about 3 wks. Seeing some blood from nose when blowing it. No sinus pain/pressure.

## 2022-05-02 ENCOUNTER — Emergency Department
Admission: EM | Admit: 2022-05-02 | Discharge: 2022-05-02 | Disposition: A | Discharge status: Home / Self Care | Payer: Medicare Other | Attending: Emergency Medicine | Admitting: Emergency Medicine

## 2022-05-02 ENCOUNTER — Encounter: Payer: Self-pay | Admitting: Emergency Medicine

## 2022-05-02 ENCOUNTER — Other Ambulatory Visit: Payer: Self-pay

## 2022-05-02 ENCOUNTER — Emergency Department: Payer: Medicare Other

## 2022-05-02 DIAGNOSIS — I10 Essential (primary) hypertension: Secondary | ICD-10-CM | POA: Diagnosis not present

## 2022-05-02 DIAGNOSIS — M545 Low back pain, unspecified: Secondary | ICD-10-CM | POA: Diagnosis present

## 2022-05-02 DIAGNOSIS — I700 Atherosclerosis of aorta: Secondary | ICD-10-CM | POA: Diagnosis not present

## 2022-05-02 DIAGNOSIS — E119 Type 2 diabetes mellitus without complications: Secondary | ICD-10-CM | POA: Diagnosis not present

## 2022-05-02 DIAGNOSIS — N200 Calculus of kidney: Secondary | ICD-10-CM | POA: Diagnosis not present

## 2022-05-02 DIAGNOSIS — M5441 Lumbago with sciatica, right side: Secondary | ICD-10-CM | POA: Diagnosis not present

## 2022-05-02 LAB — BASIC METABOLIC PANEL
Anion gap: 10 (ref 5–15)
BUN: 30 mg/dL — ABNORMAL HIGH (ref 8–23)
CO2: 29 mmol/L (ref 22–32)
Calcium: 10.2 mg/dL (ref 8.9–10.3)
Chloride: 102 mmol/L (ref 98–111)
Creatinine, Ser: 1.46 mg/dL — ABNORMAL HIGH (ref 0.61–1.24)
GFR, Estimated: 49 mL/min — ABNORMAL LOW (ref >60)
Glucose, Bld: 88 mg/dL (ref 70–99)
Potassium: 4 mmol/L (ref 3.5–5.1)
Sodium: 141 mmol/L (ref 135–145)

## 2022-05-02 LAB — CBC
HCT: 38.9 % — ABNORMAL LOW (ref 39.0–52.0)
Hemoglobin: 13 g/dL (ref 13.0–17.0)
MCH: 29.1 pg (ref 26.0–34.0)
MCHC: 33.4 g/dL (ref 30.0–36.0)
MCV: 87.2 fL (ref 80.0–100.0)
Platelets: 149 10*3/uL — ABNORMAL LOW (ref 150–400)
RBC: 4.46 MIL/uL (ref 4.22–5.81)
RDW: 13.4 % (ref 11.5–15.5)
WBC: 5.9 10*3/uL (ref 4.0–10.5)
nRBC: 0 % (ref 0.0–0.2)

## 2022-05-02 LAB — URINALYSIS, ROUTINE W REFLEX MICROSCOPIC
Bacteria, UA: NONE SEEN
Bilirubin Urine: NEGATIVE
Glucose, UA: NEGATIVE mg/dL
Ketones, ur: NEGATIVE mg/dL
Leukocytes,Ua: NEGATIVE
Nitrite: NEGATIVE
Protein, ur: 100 mg/dL — AB
Specific Gravity, Urine: 1.01 (ref 1.005–1.030)
pH: 5 (ref 5.0–8.0)

## 2022-05-02 MED ORDER — LIDOCAINE 5 % EX PTCH: 1.0000 | MEDICATED_PATCH | CUTANEOUS | 0 refills | Status: DC

## 2022-05-02 MED ORDER — HYDROMORPHONE HCL 1 MG/ML IJ SOLN
0.5000 mg | Freq: Once | INTRAMUSCULAR | Status: AC
Start: 2022-05-02 — End: 2022-05-02
  Administered 2022-05-02: 0.5 mg via INTRAVENOUS
  Filled 2022-05-02: qty 0.5

## 2022-05-02 MED ORDER — OXYCODONE-ACETAMINOPHEN 5-325 MG PO TABS: 1.0000 | ORAL_TABLET | Freq: Once | ORAL | Status: DC

## 2022-05-02 MED ORDER — LIDOCAINE 5 % EX PTCH
1.0000 | MEDICATED_PATCH | CUTANEOUS | Status: DC
  Administered 2022-05-02: 1 via TRANSDERMAL
  Filled 2022-05-02: qty 1

## 2022-05-02 MED ORDER — OXYCODONE HCL 5 MG PO TABS: 5.0000 mg | ORAL_TABLET | Freq: Three times a day (TID) | ORAL | 0 refills | Status: AC | PRN

## 2022-05-02 MED ORDER — IOHEXOL 300 MG/ML  SOLN
100.0000 mL | Freq: Once | INTRAMUSCULAR | Status: AC | PRN
  Administered 2022-05-02: 100 mL via INTRAVENOUS

## 2022-05-02 NOTE — ED Triage Notes (Signed)
[facility] report - pt with right hip / that started 2 days ago. Pt also has a mass right back and has urinary frequency.

## 2022-05-02 NOTE — Discharge Instructions (Addendum)
Your CAT scan and blood work were all reassuring.  I suspect that your pain is due to a pinched nerve in your back.  In addition to the Tylenol you can use the Lidoderm patch over the area of pain and you can take the oxycodone as needed but please do not drive when you are taking this.  It can also make you constipated and prone to falls.

## 2022-05-02 NOTE — ED Triage Notes (Signed)
Patient to ED via POV with right flank pain- mild pain for past 2-3 weeks. Patient states he has been urinating a lot lately. Patient states pain is similar to when he had a kidney stone.

## 2022-05-02 NOTE — ED Notes (Signed)
Patient states he had kidney stones, and blood in urine. 3 months ago

## 2022-05-02 NOTE — ED Provider Notes (Signed)
[facility] Provider Note    Event Date/Time   First MD Initiated Contact with Patient 05/02/22 1316     (approximate)   History   Flank Pain   HPI  Joseph HARMES Sr. is a 76 y.o. male past medical history of BPH, diabetes, GERD, kidney stones who presents with right low back/flank pain.  Symptoms started several days ago.  Denies any preceding trauma.  Pain is located in the right low back radiating to the buttock occasionally down to the knee but not past the knee.  Denies numbness tingling bowel bladder incontinence.  Denies fevers chills.  He has never had this pain before says the only thing he can compare to is when he had a kidney stone.  Has had urinary frequency but no blood in his urine no urgency no dysuria.  Pain does radiate around to the abdomen.     Past Medical History:  Diagnosis Date   BPH (benign prostatic hyperplasia)    Diabetes mellitus without complication (HCC)    GERD (gastroesophageal reflux disease)    History of kidney stones    Hypertension    Sleep apnea     There are no problems to display for this patient.    Physical Exam  Triage Vital Signs: ED Triage Vitals  Enc Vitals Group     BP 05/02/22 1135 (!) 164/61     Pulse Rate 05/02/22 1139 92     Resp 05/02/22 1135 18     Temp 05/02/22 1135 98.2 F (36.8 C)     Temp Source 05/02/22 1135 Oral     SpO2 05/02/22 1135 97 %     Weight 05/02/22 1331 250 lb (113.4 kg)     Height 05/02/22 1331 5\' 9"  (1.753 m)     Head Circumference --      Peak Flow --      Pain Score 05/02/22 1139 5     Pain Loc --      Pain Edu? --      Excl. in GC? --     Most recent vital signs: Vitals:   05/02/22 1139 05/02/22 1539  BP:  (!) 160/60  Pulse: 92 80  Resp:  18  Temp:  98 F (36.7 C)  SpO2:  98%     General: Awake, no distress.  CV:  Good peripheral perfusion.  Resp:  Normal effort. Abd:  No distention.  Obese, mild tenderness in the right lower quadrant Neuro:              Awake, Alert, Oriented x 3  Other:  Patient is comfortable at rest but very uncomfortable with standing up and moving around in the bed, tenderness to palpation over the right SI joint no CVA tenderness no midline lumbar tenderness Negative straight leg raise 5 out of 5 strength with plantarflexion dorsiflexion bilaterally Patient is able to ambulate but with pain   ED Results / Procedures / Treatments  Labs (all labs ordered are listed, but only abnormal results are displayed) Labs Reviewed  URINALYSIS, ROUTINE W REFLEX MICROSCOPIC - Abnormal; Notable for the following components:      Result Value   Color, Urine STRAW (*)    APPearance CLEAR (*)    Hgb urine dipstick SMALL (*)    Protein, ur 100 (*)    All other components within normal limits  BASIC METABOLIC PANEL - Abnormal; Notable for the following components:   BUN 30 (*)  Creatinine, Ser 1.46 (*)    GFR, Estimated 49 (*)    All other components within normal limits  CBC - Abnormal; Notable for the following components:   HCT 38.9 (*)    Platelets 149 (*)    All other components within normal limits     EKG     RADIOLOGY Reviewed and interpreted the CT abdomen pelvis which is negative for acute abnormality   PROCEDURES:  Critical Care performed: No  Procedures   MEDICATIONS ORDERED IN ED: Medications  lidocaine (LIDODERM) 5 % 1 patch (1 patch Transdermal Patch Applied 05/02/22 1417)  HYDROmorphone (DILAUDID) injection 0.5 mg (0.5 mg Intravenous Given 05/02/22 1417)  iohexol (OMNIPAQUE) 300 MG/ML solution 100 mL (100 mLs Intravenous Contrast Given 05/02/22 1427)     IMPRESSION / MDM / ASSESSMENT AND PLAN / ED COURSE  I reviewed the triage vital signs and the nursing notes.                              Patient's presentation is most consistent with acute complicated illness / injury requiring diagnostic workup.  Differential diagnosis includes, but is not limited to, lumbar  radiculopathy/sciatica, referred pain from kidney stone, appendicitis, atypical diverticulitis, AAA, less likely cauda equina syndrome, spinal epidural abscess  The patient is a 76 year old male who presents with right low back pain rating to the buttock.  This started several days ago was not preceded by any trauma.  Pain occasionally goes to the knee but does not go further down the leg is not associate with numbness bowel bladder incontinence fevers or leg weakness.  Patient does have some pain and radiates around to the abdomen and has had some increased urination but no dysuria.  No fevers.  Patient is comfortable at rest but when getting up to walk around able to ambulate but is significantly uncomfortable.  He has some tenderness over the right SI joint with no midline lumbar tenderness he has normal strength and sensation in the lower extremities.  Abdominal exam has some mild right lower quadrant tenderness.  Overall my suspicion is that this is a lumbar radiculopathy but given his age and associated abdominal pain I did obtain a CT abdomen pelvis which is negative for acute abnormality.  CBC and BMP were reassuring urinalysis did not suggest infection.  Patient has been told he is not supposed to take NSAIDs due to history of rectal bleeding.  Recommended Tylenol and I prescribed him oxycodone.  Discussed supportive measures and primary follow-up.  Discussed reasons to return to emergency department mainly symptoms of cauda equina syndrome.       FINAL CLINICAL IMPRESSION(S) / ED DIAGNOSES   Final diagnoses:  Acute right-sided low back pain with right-sided sciatica     Rx / DC Orders   ED Discharge Orders          Ordered    oxyCODONE (ROXICODONE) 5 MG immediate release tablet  Every 8 hours PRN        05/02/22 1532    lidocaine (LIDODERM) 5 %  Every 24 hours        05/02/22 1532             Note:  This document was prepared using Dragon voice recognition software and may  include unintentional dictation errors.   Joseph Hacking, MD 05/02/22 [phone]

## 2023-01-10 ENCOUNTER — Ambulatory Visit
Admission: EM | Admit: 2023-01-10 | Discharge: 2023-01-10 | Disposition: A | Discharge status: Home / Self Care | Payer: Medicare Other | Attending: Emergency Medicine | Admitting: Emergency Medicine

## 2023-01-10 ENCOUNTER — Other Ambulatory Visit: Payer: Self-pay

## 2023-01-10 DIAGNOSIS — N492 Inflammatory disorders of scrotum: Secondary | ICD-10-CM

## 2023-01-10 MED ORDER — DOXYCYCLINE HYCLATE 100 MG PO CAPS
100.0000 mg | ORAL_CAPSULE | Freq: Two times a day (BID) | ORAL | 0 refills | Status: AC
Start: 2023-01-10 — End: 2023-01-20

## 2023-01-10 MED ORDER — DEXAMETHASONE SODIUM PHOSPHATE 10 MG/ML IJ SOLN
10.0000 mg | Freq: Once | INTRAMUSCULAR | Status: AC
  Administered 2023-01-10: 10 mg via INTRAMUSCULAR

## 2023-01-10 MED ORDER — TRAMADOL HCL 50 MG PO TABS: 50.0000 mg | ORAL_TABLET | Freq: Four times a day (QID) | ORAL | 0 refills | Status: DC | PRN

## 2023-01-10 MED ORDER — CEFTRIAXONE SODIUM 500 MG IJ SOLR
500.0000 mg | INTRAMUSCULAR | Status: DC
  Administered 2023-01-10: 500 mg via INTRAMUSCULAR

## 2023-01-10 NOTE — ED Triage Notes (Signed)
Pt c/o knot between scrotum and rectum area x 1 wk and has been painful x 3 days. Pt denies fever

## 2023-01-10 NOTE — Discharge Instructions (Addendum)
Today you have been evaluated for cyst, placed on placement will not attempt to drain it as it is located within your scrotum  You have been given an injection of a antibiotic and steroid here today in the office to help speed up start of treatment we will start to see some improvement in the next 30 minutes to an hour  Take doxycycline every morning and every evening for 10 days  Patient use of Tylenol 500 to 1000 mg every 6 hours for pain, if ineffective then you may attempt use of tramadol every 6 hours, please be mindful of this will make you feel sleepy  Hold warm-hot compresses or complete warm soaks to affected area at least 4 times a day, this helps to facilitate draining, the more the better  Please return for evaluation for increased swelling, increased tenderness or pain, non healing site, non draining site, you begin to have fever or chills   If no improvement with use of medication please reach out to urology for further evaluation and management, information is listed on front page

## 2023-01-10 NOTE — ED Provider Notes (Signed)
Joseph Hensley    CSN: [id number] Arrival date & time: 01/10/23  1007      History   Chief Complaint Chief Complaint  Patient presents with   Cyst    HPI Joseph STAGNARO Sr. is a 77 y.o. male.   Patient presents for evaluation of a cyst present between the scrotum and the anus for 7 days.  Began to experience pain over the last 3 days described as severe, rating a 15 out of 10, making it difficult to walk, sit and interfering with sleep.  Believes to be the size of a quarter to a golf ball.  Has attempted use of topical Desitin cream which has been ineffective.  Denies presence of drainage or fever.  Has not occurred before.   Past Medical History:  Diagnosis Date   BPH (benign prostatic hyperplasia)    Diabetes mellitus without complication (HCC)    GERD (gastroesophageal reflux disease)    History of kidney stones    Hypertension    Sleep apnea     There are no problems to display for this patient.   Past Surgical History:  Procedure Laterality Date   CHOLECYSTECTOMY     COLONOSCOPY N/A 04/02/2017   Procedure: COLONOSCOPY;  Surgeon: Scot Jun, MD;  Location: [facility] ENDOSCOPY;  Service: Endoscopy;  Laterality: N/A;   COLONOSCOPY WITH PROPOFOL N/A 04/02/2017   Procedure: COLONOSCOPY WITH PROPOFOL;  Surgeon: Scot Jun, MD;  Location: [facility] ENDOSCOPY;  Service: Endoscopy;  Laterality: N/A;   nasal somnoplasty     ROTATOR CUFF REPAIR Left        Home Medications    Prior to Admission medications   Medication Sig Start Date End Date Taking? Authorizing Provider  amLODipine (NORVASC) 10 MG tablet Take 10 mg by mouth daily.   Yes [provider]  aspirin EC 81 MG tablet Take 81 mg by mouth daily.   Yes [provider]  doxycycline (VIBRAMYCIN) 100 MG capsule Take 1 capsule (100 mg total) by mouth 2 (two) times daily for 10 days. 01/10/23 01/20/23 Yes Braedon Sjogren R, NP  fenofibrate (TRICOR) 145 MG tablet Take 145 mg by mouth  daily.   Yes [provider]  furosemide (LASIX) 20 MG tablet Take 20 mg by mouth daily. As needed for up to 3 days for leg swelling   Yes [provider]  gabapentin (NEURONTIN) 300 MG capsule Take 300 mg by mouth 2 (two) times daily.   Yes [provider]  hydrochlorothiazide (HYDRODIURIL) 25 MG tablet Take 25 mg by mouth daily.   Yes [provider]  insulin glargine (LANTUS) 100 UNIT/ML injection Inject 65 Units into the skin 2 (two) times daily.   Yes [provider]  insulin lispro protamine-lispro (HUMALOG 50/50 MIX) (50-50) 100 UNIT/ML SUSP injection Inject into the skin 3 (three) times daily before meals. Take 20units before breakfast, 25units before lunch, and 35units before dinner   Yes [provider]  lovastatin (MEVACOR) 20 MG tablet Take 20 mg by mouth at bedtime.   Yes [provider]  metFORMIN (GLUCOPHAGE) 1000 MG tablet Take 1,000 mg by mouth 2 (two) times daily with a meal.   Yes [provider]  ofloxacin (OCUFLOX) 0.3 % ophthalmic solution Place 2 drops into the left eye 2 (two) times daily.   Yes [provider]  pantoprazole (PROTONIX) 40 MG tablet Take 40 mg by mouth daily.   Yes [provider]  tamsulosin (FLOMAX) 0.4 MG CAPS  capsule Take 1 capsule (0.4 mg total) by mouth daily after breakfast. 06/22/15  Yes Emily Filbert, MD  telmisartan (MICARDIS) 80 MG tablet Take 80 mg by mouth daily.   Yes [provider]  traMADol (ULTRAM) 50 MG tablet Take 1 tablet (50 mg total) by mouth every 6 (six) hours as needed. 01/10/23  Yes Kaylor Maiers R, NP  fluticasone (FLONASE) 50 MCG/ACT nasal spray Place 2 sprays into both nostrils daily.    [provider]  ipratropium (ATROVENT) 0.06 % nasal spray Place 2 sprays into both nostrils 4 (four) times daily. 05/09/21   Eusebio Friendly B, PA-C  lidocaine (LIDODERM) 5 % Place 1 patch onto the skin daily. Remove & Discard patch  within 12 hours or as directed by MD 05/02/22   Georga Hacking, MD  polyethylene glycol-electrolytes (NULYTELY/GOLYTELY) 420 g solution Take 4,000 mLs by mouth once.    [provider]    Family History History reviewed. No pertinent family history.  Social History Social History   Tobacco Use   Smoking status: Never   Smokeless tobacco: Never  Vaping Use   Vaping status: Never Used  Substance Use Topics   Alcohol use: No   Drug use: No     Allergies   Metoprolol succinate [metoprolol]   Review of Systems Review of Systems  Skin:  Positive for wound. Negative for color change, pallor and rash.     Physical Exam Triage Vital Signs ED Triage Vitals  Encounter Vitals Group     BP 01/10/23 1022 (!) 157/51     Systolic BP Percentile --      Diastolic BP Percentile --      Pulse Rate 01/10/23 1022 98     Resp 01/10/23 1022 18     Temp 01/10/23 1022 99.5 F (37.5 C)     Temp src --      SpO2 01/10/23 1022 99 %     Weight --      Height --      Head Circumference --      Peak Flow --      Pain Score 01/10/23 1017 10     Pain Loc --      Pain Education --      Exclude from Growth Chart --    No data found.  Updated Vital Signs BP (!) 157/51   Pulse 98   Temp 99.5 F (37.5 C)   Resp 18   SpO2 99%   Visual Acuity Right Eye Distance:   Left Eye Distance:   Bilateral Distance:    Right Eye Near:   Left Eye Near:    Bilateral Near:     Physical Exam Constitutional:      Appearance: Normal appearance.  Eyes:     Extraocular Movements: Extraocular movements intact.  Pulmonary:     Effort: Pulmonary effort is normal.  Genitourinary:      Comments: 2 x 2 cm abscess present where the perineum and the scrotum meet, tender and erythematous Neurological:     Mental Status: He is alert and oriented to person, place, and time. Mental status is at baseline.      UC Treatments / Results  Labs (all labs ordered are listed, but only abnormal  results are displayed) Labs Reviewed - No data to display  EKG   Radiology No results found.  Procedures Procedures (including critical care time)  Medications Ordered in UC Medications  cefTRIAXone (ROCEPHIN) injection 500 mg (500  mg Intramuscular Given 01/10/23 1113)  dexamethasone (DECADRON) injection 10 mg (10 mg Intramuscular Given 01/10/23 1113)    Initial Impression / Assessment and Plan / UC Course  I have reviewed the triage vital signs and the nursing notes.  Pertinent labs & imaging results that were available during my care of the patient were reviewed by me and considered in my medical decision making (see chart for details).  Abscess of the scrotum  Vital signs are stable, no signs of sepsis, will not attempt I&D today, discussed with patient and spouse, Rocephin and Decadron injection given in office and prescribed doxycycline and tramadol for outpatient use, PDMP reviewed, low risk, discussed sedative effects of narcotic, verbalized understanding, additionally recommended use of Tylenol, warm compresses and soaks for supportive treatment, advised follow-up with urology if symptoms persist or worsen due to placement, may follow-up with urgent care for reevaluation as needed for nonhealing site Final Clinical Impressions(s) / UC Diagnoses   Final diagnoses:  Abscess, scrotum     Discharge Instructions      Today you have been evaluated for cyst, placed on placement will not attempt to drain it as it is located within your scrotum  You have been given an injection of a antibiotic and steroid here today in the office to help speed up start of treatment we will start to see some improvement in the next 30 minutes to an hour  Take doxycycline every morning and every evening for 10 days  Patient use of Tylenol 500 to 1000 mg every 6 hours for pain, if ineffective then you may attempt use of tramadol every 6 hours, please be mindful of this will make you feel  sleepy  Hold warm-hot compresses or complete warm soaks to affected area at least 4 times a day, this helps to facilitate draining, the more the better  Please return for evaluation for increased swelling, increased tenderness or pain, non healing site, non draining site, you begin to have fever or chills   If no improvement with use of medication please reach out to urology for further evaluation and management, information is listed on front page     ED Prescriptions     Medication Sig Dispense Auth. Provider   doxycycline (VIBRAMYCIN) 100 MG capsule Take 1 capsule (100 mg total) by mouth 2 (two) times daily for 10 days. 20 capsule Filippa Yarbough R, NP   traMADol (ULTRAM) 50 MG tablet Take 1 tablet (50 mg total) by mouth every 6 (six) hours as needed. 15 tablet Shyheim Tanney, Elita Boone, NP      I have reviewed the PDMP during this encounter.   Valinda Hoar, [place] 01/10/23 [phone]

## 2023-01-17 ENCOUNTER — Other Ambulatory Visit: Payer: Self-pay | Admitting: Urology

## 2023-01-17 ENCOUNTER — Other Ambulatory Visit: Payer: Self-pay

## 2023-01-17 ENCOUNTER — Ambulatory Visit
Admission: RE | Admit: 2023-01-17 | Discharge: 2023-01-17 | Disposition: A | Discharge status: Home / Self Care | Payer: Medicare Other | Source: Ambulatory Visit | Attending: Urology

## 2023-01-17 ENCOUNTER — Ambulatory Visit (INDEPENDENT_AMBULATORY_CARE_PROVIDER_SITE_OTHER): Payer: Medicare Other | Admitting: Urology

## 2023-01-17 VITALS — BP 156/71 | HR 102 | Ht 69.0 in | Wt 255.0 lb

## 2023-01-17 DIAGNOSIS — N492 Inflammatory disorders of scrotum: Secondary | ICD-10-CM | POA: Diagnosis not present

## 2023-01-17 DIAGNOSIS — R102 Pelvic and perineal pain: Secondary | ICD-10-CM | POA: Diagnosis present

## 2023-01-17 DIAGNOSIS — N5089 Other specified disorders of the male genital organs: Secondary | ICD-10-CM | POA: Insufficient documentation

## 2023-01-17 DIAGNOSIS — R1030 Lower abdominal pain, unspecified: Secondary | ICD-10-CM

## 2023-01-17 LAB — POCT I-STAT CREATININE: Creatinine, Ser: 1.7 mg/dL — ABNORMAL HIGH (ref 0.61–1.24)

## 2023-01-17 MED ORDER — IOHEXOL 300 MG/ML  SOLN
100.0000 mL | Freq: Once | INTRAMUSCULAR | Status: AC | PRN
  Administered 2023-01-17: 80 mL via INTRAVENOUS

## 2023-01-17 NOTE — H&P (View-Only) (Signed)
Marcelle Overlie Plume,acting as a scribe for Vanna Scotland, MD.,have documented all relevant documentation on the behalf of Vanna Scotland, MD,as directed by  Vanna Scotland, MD while in the presence of Vanna Scotland, MD.  01/17/2023 11:56 AM   Joseph Priest Sr. 1945/10/28 [id number]  Referring provider: Valinda Hoar, NP [address] [place],  [place] [address]  Chief Complaint  Patient presents with   Scrotal Abcess    HPI: 77 year-old male who presented  to urgent care on 01/10/2023 with about 2 weeks of perineal scrotal pain and swelling.   He was diagnosed with a scrotal abscess at the base of his scrotum by urgent care on 01/10/2023 but I&D was not at attempted. He was told to follow up as an outpatient with urology. He has been on antibiotics in the form of doxycycline and he has had some improvement in the overall size and swelling.   That being said, the pain and swelling is severe.  He reports a reduction in size from a tennis ball to a golf ball or ping-pong now.  He denies any drainage.   He is not having any fevers or chills.   He is in significant discomfort since the onset of his symptoms which have eased slightly.  He is a diabetic and his blood sugars have been under reasonably good control in the past couple of days since being on the antibiotics.   He is not currently NPO.  He ate a large breakfast this morning.   PMH: Past Medical History:  Diagnosis Date   BPH (benign prostatic hyperplasia)    Diabetes mellitus without complication (HCC)    GERD (gastroesophageal reflux disease)    History of kidney stones    Hypertension    Sleep apnea     Surgical History: Past Surgical History:  Procedure Laterality Date   CHOLECYSTECTOMY     COLONOSCOPY N/A 04/02/2017   Procedure: COLONOSCOPY;  Surgeon: Scot Jun, MD;  Location: [facility] ENDOSCOPY;  Service: Endoscopy;  Laterality: N/A;   COLONOSCOPY WITH PROPOFOL N/A 04/02/2017   Procedure: COLONOSCOPY  WITH PROPOFOL;  Surgeon: Scot Jun, MD;  Location[facility] ENDOSCOPY;  Service: Endoscopy;  Laterality: N/A;   nasal somnoplasty     ROTATOR CUFF REPAIR Left     Home Medications:  Allergies as of 01/17/2023       Reactions   Metoprolol    Caused chest pain Other Reaction(s): Other (See Comments)        Medication List        Accurate as of January 17, 2023 11:56 AM. If you have any questions, ask your nurse or doctor.          STOP taking these medications    fluticasone 50 MCG/ACT nasal spray Commonly known as: FLONASE Stopped by: Vanna Scotland   ipratropium 0.06 % nasal spray Commonly known as: ATROVENT Stopped by: Vanna Scotland   lidocaine 5 % Commonly known as: Lidoderm Stopped by: Vanna Scotland   ofloxacin 0.3 % ophthalmic solution Commonly known as: OCUFLOX Stopped by: Vanna Scotland   polyethylene glycol-electrolytes 420 g solution Commonly known as: NuLYTELY Stopped by: Vanna Scotland   tamsulosin 0.4 MG Caps capsule Commonly known as: FLOMAX Stopped by: Vanna Scotland   telmisartan 80 MG tablet Commonly known as: MICARDIS Stopped by: Vanna Scotland       TAKE these medications    acetaminophen 500 MG tablet Commonly known as: TYLENOL Take 500 mg by mouth as  needed.   amLODipine 10 MG tablet Commonly known as: NORVASC Take 10 mg by mouth daily.   aspirin EC 81 MG tablet Take 81 mg by mouth daily.   carvedilol 6.25 MG tablet Commonly known as: COREG Take 6.25 mg by mouth 2 (two) times daily.   doxycycline 100 MG capsule Commonly known as: VIBRAMYCIN Take 1 capsule (100 mg total) by mouth 2 (two) times daily for 10 days.   fenofibrate 145 MG tablet Commonly known as: TRICOR Take 145 mg by mouth daily.   furosemide 20 MG tablet Commonly known as: LASIX Take 20 mg by mouth daily. As needed for up to 3 days for leg swelling   gabapentin 400 MG capsule Commonly known as: NEURONTIN Take 400 mg by mouth 2 (two) times  daily. What changed: Another medication with the same name was removed. Continue taking this medication, and follow the directions you see here. Changed by: Vanna Scotland   GE100 Blood Glucose Test test strip Generic drug: glucose blood USE TO TEST FOUR TIMES DAILY AS DIRECTED.   HumaLOG KwikPen 100 UNIT/ML KwikPen Generic drug: insulin lispro Inject into the skin.   hydrALAZINE 25 MG tablet Commonly known as: APRESOLINE TAKE 1 TABLET BY MOUTH UP TO TWICE DAILYAS NEEDED IF SBP>180 OR DBP>100   hydrochlorothiazide 25 MG tablet Commonly known as: HYDRODIURIL Take 25 mg by mouth daily.   insulin glargine 100 UNIT/ML injection Commonly known as: LANTUS Inject 65 Units into the skin 2 (two) times daily.   insulin lispro protamine-lispro (50-50) 100 UNIT/ML Susp injection Commonly known as: HUMALOG 50/50 MIX Inject into the skin 3 (three) times daily before meals. Take 20units before breakfast, 25units before lunch, and 35units before dinner   lisinopril 40 MG tablet Commonly known as: ZESTRIL Take 40 mg by mouth daily.   lovastatin 20 MG tablet Commonly known as: MEVACOR Take 20 mg by mouth at bedtime.   metFORMIN 1000 MG tablet Commonly known as: GLUCOPHAGE Take 1,000 mg by mouth 2 (two) times daily with a meal.   pantoprazole 40 MG tablet Commonly known as: PROTONIX Take 40 mg by mouth daily.   Pen Needles 3/16" 31G X 5 MM Misc USE TO TEST 5 TIMES DAILY   Global Ease Inject Pen Needles 31G X 8 MM Misc Generic drug: Insulin Pen Needle Inject into the skin.   Procysbi 300 MG Pack Generic drug: Cysteamine Bitartrate See admin instructions.   traMADol 50 MG tablet Commonly known as: ULTRAM Take 1 tablet (50 mg total) by mouth every 6 (six) hours as needed.   triamcinolone 0.025 % cream Commonly known as: KENALOG Apply topically.   Vitamin D3 10 MCG (400 UNIT) tablet Take 400 Units by mouth daily.        Allergies:  Allergies  Allergen Reactions    Metoprolol     Caused chest pain  Other Reaction(s): Other (See Comments)     Social History:  reports that he has never smoked. He has never used smokeless tobacco. He reports that he does not drink alcohol and does not use drugs.   Physical Exam: BP (!) 156/71   Pulse (!) 102   Ht 5\' 9"  (1.753 m)   Wt 255 lb (115.7 kg)   BMI 37.66 kg/m   Constitutional:  Alert and oriented, No acute distress. HEENT: [place] AT, moist mucus membranes.  Trachea midline, no masses. GU: He had a large indurated mass at the base of his scrotum, measuring 5 cm by 3 cm. There  was one area about golf ball size that had some fluctuance. There is no overlying necrosis and was not able to induce any spontaneous drainage with compression.  No crepitus.  His scrotum is otherwise unremarkable.  Does not appear to involve the anus. Neurologic: Grossly intact, no focal deficits, moving all 4 extremities. Psychiatric: Normal mood and affect.  Assessment & Plan:    1. Scrotal/Perineal abscess - Managed medically and is improving but there is an area of central fluctuance  - He does not appear septic or toxic - He has had some improvement in the swelling - It does appear that it needs to be drained/ explored given its proximity to his anus/ rectum and bulbar urethra as well as its overall size, I do think it is prudent to proceed with imaging to rule out underlying conditions or involvement of other structures, as well as rule out Fournier gangrene, given that he is a diabetic, although this would be a relatively unusual insidious onset - We discussed sending him to the emergency room for I&D later once he spent 8 hours NPO versus scheduling this urgently on Monday, given that he has had some improvement. - It seems clinically well if his CT scan is reasonable, we can wait until Monday, but strict warning precautions were given about if and when to go to the emergency room, including signs of systemic infection, worsening  pain, or if the swelling starts to worsen rather than continue to improve. - He is agreeable to this plan and will plan for I&D in the OR on Monday.  - Risk and benefits of the procedure were discussed including the possible need for admission if the abscess is worse than anticipated, need for daily packing, need for re-exploration, damage to surrounding structures, bleeding amongst others. All questions were answered. He and his wife are agreeable to this plan.   Return for CT abdomen pelvis.   [facility] [address] [place], [place] [address] [phone]

## 2023-01-17 NOTE — Progress Notes (Signed)
   [facility] Posting Form  Surgery Date: Date: 01/20/2023  Surgeon: Dr. Vanna Scotland, MD  Inpt ( No  )   Outpt (Yes)   Obs ( No  )   Diagnosis: N49.2 Scrotal Abscess  -CPT: 55100  Surgery: Incision and Drainage of Scrotal/Perineal Abscess   Stop Anticoagulations: Yes, may continue ASA  Cardiac/Medical/Pulmonary Clearance needed: no  *Orders entered into EPIC  Date: 01/17/23   *Case booked in EPIC  Date: 01/17/23  *Notified pt of Surgery: Date: 01/17/23  PRE-OP UA & CX: no  *Placed into Prior Authorization [place] Date: 01/17/23  Assistant/laser/rep:No

## 2023-01-17 NOTE — Progress Notes (Signed)
Surgical Physician Order Form [facility]  Dr. Vanna Scotland, MD  * Scheduling expectation : Next Available  *Length of Case:   *Clearance needed: no  *Anticoagulation Instructions: Hold all anticoagulants  *Aspirin Instructions: Ok to continue Aspirin  *Post-op visit Date/Instructions:  TBD  *Diagnosis: Scrotal/perineal abscess  *Procedure: Incision/drainage of scrotal/perineal abscess  Additional orders: N/A  -Admit type: OUTpatient  -Anesthesia: General  -VTE Prophylaxis Standing Order SCD's       Other:   -Standing Lab Orders Per Anesthesia    Lab other: None  -Standing Test orders EKG/Chest x-ray per Anesthesia       Test other:   - Medications:  Ancef 2gm IV  -Other orders:  N/A

## 2023-01-17 NOTE — Progress Notes (Addendum)
Marcelle Overlie Plume,acting as a scribe for Vanna Scotland, MD.,have documented all relevant documentation on the behalf of Vanna Scotland, MD,as directed by  Vanna Scotland, MD while in the presence of Vanna Scotland, MD.  01/17/2023 11:56 AM   Joseph Priest Sr. 1945/10/28 [id number]  Referring provider: Valinda Hoar, NP [address] [place],  [place] [address]  Chief Complaint  Patient presents with   Scrotal Abcess    HPI: 77 year-old male who presented  to urgent care on 01/10/2023 with about 2 weeks of perineal scrotal pain and swelling.   He was diagnosed with a scrotal abscess at the base of his scrotum by urgent care on 01/10/2023 but I&D was not at attempted. He was told to follow up as an outpatient with urology. He has been on antibiotics in the form of doxycycline and he has had some improvement in the overall size and swelling.   That being said, the pain and swelling is severe.  He reports a reduction in size from a tennis ball to a golf ball or ping-pong now.  He denies any drainage.   He is not having any fevers or chills.   He is in significant discomfort since the onset of his symptoms which have eased slightly.  He is a diabetic and his blood sugars have been under reasonably good control in the past couple of days since being on the antibiotics.   He is not currently NPO.  He ate a large breakfast this morning.   PMH: Past Medical History:  Diagnosis Date   BPH (benign prostatic hyperplasia)    Diabetes mellitus without complication (HCC)    GERD (gastroesophageal reflux disease)    History of kidney stones    Hypertension    Sleep apnea     Surgical History: Past Surgical History:  Procedure Laterality Date   CHOLECYSTECTOMY     COLONOSCOPY N/A 04/02/2017   Procedure: COLONOSCOPY;  Surgeon: Scot Jun, MD;  Location: [facility] ENDOSCOPY;  Service: Endoscopy;  Laterality: N/A;   COLONOSCOPY WITH PROPOFOL N/A 04/02/2017   Procedure: COLONOSCOPY  WITH PROPOFOL;  Surgeon: Scot Jun, MD;  Location[facility] ENDOSCOPY;  Service: Endoscopy;  Laterality: N/A;   nasal somnoplasty     ROTATOR CUFF REPAIR Left     Home Medications:  Allergies as of 01/17/2023       Reactions   Metoprolol    Caused chest pain Other Reaction(s): Other (See Comments)        Medication List        Accurate as of January 17, 2023 11:56 AM. If you have any questions, ask your nurse or doctor.          STOP taking these medications    fluticasone 50 MCG/ACT nasal spray Commonly known as: FLONASE Stopped by: Vanna Scotland   ipratropium 0.06 % nasal spray Commonly known as: ATROVENT Stopped by: Vanna Scotland   lidocaine 5 % Commonly known as: Lidoderm Stopped by: Vanna Scotland   ofloxacin 0.3 % ophthalmic solution Commonly known as: OCUFLOX Stopped by: Vanna Scotland   polyethylene glycol-electrolytes 420 g solution Commonly known as: NuLYTELY Stopped by: Vanna Scotland   tamsulosin 0.4 MG Caps capsule Commonly known as: FLOMAX Stopped by: Vanna Scotland   telmisartan 80 MG tablet Commonly known as: MICARDIS Stopped by: Vanna Scotland       TAKE these medications    acetaminophen 500 MG tablet Commonly known as: TYLENOL Take 500 mg by mouth as  needed.   amLODipine 10 MG tablet Commonly known as: NORVASC Take 10 mg by mouth daily.   aspirin EC 81 MG tablet Take 81 mg by mouth daily.   carvedilol 6.25 MG tablet Commonly known as: COREG Take 6.25 mg by mouth 2 (two) times daily.   doxycycline 100 MG capsule Commonly known as: VIBRAMYCIN Take 1 capsule (100 mg total) by mouth 2 (two) times daily for 10 days.   fenofibrate 145 MG tablet Commonly known as: TRICOR Take 145 mg by mouth daily.   furosemide 20 MG tablet Commonly known as: LASIX Take 20 mg by mouth daily. As needed for up to 3 days for leg swelling   gabapentin 400 MG capsule Commonly known as: NEURONTIN Take 400 mg by mouth 2 (two) times  daily. What changed: Another medication with the same name was removed. Continue taking this medication, and follow the directions you see here. Changed by: Vanna Scotland   GE100 Blood Glucose Test test strip Generic drug: glucose blood USE TO TEST FOUR TIMES DAILY AS DIRECTED.   HumaLOG KwikPen 100 UNIT/ML KwikPen Generic drug: insulin lispro Inject into the skin.   hydrALAZINE 25 MG tablet Commonly known as: APRESOLINE TAKE 1 TABLET BY MOUTH UP TO TWICE DAILYAS NEEDED IF SBP>180 OR DBP>100   hydrochlorothiazide 25 MG tablet Commonly known as: HYDRODIURIL Take 25 mg by mouth daily.   insulin glargine 100 UNIT/ML injection Commonly known as: LANTUS Inject 65 Units into the skin 2 (two) times daily.   insulin lispro protamine-lispro (50-50) 100 UNIT/ML Susp injection Commonly known as: HUMALOG 50/50 MIX Inject into the skin 3 (three) times daily before meals. Take 20units before breakfast, 25units before lunch, and 35units before dinner   lisinopril 40 MG tablet Commonly known as: ZESTRIL Take 40 mg by mouth daily.   lovastatin 20 MG tablet Commonly known as: MEVACOR Take 20 mg by mouth at bedtime.   metFORMIN 1000 MG tablet Commonly known as: GLUCOPHAGE Take 1,000 mg by mouth 2 (two) times daily with a meal.   pantoprazole 40 MG tablet Commonly known as: PROTONIX Take 40 mg by mouth daily.   Pen Needles 3/16" 31G X 5 MM Misc USE TO TEST 5 TIMES DAILY   Global Ease Inject Pen Needles 31G X 8 MM Misc Generic drug: Insulin Pen Needle Inject into the skin.   Procysbi 300 MG Pack Generic drug: Cysteamine Bitartrate See admin instructions.   traMADol 50 MG tablet Commonly known as: ULTRAM Take 1 tablet (50 mg total) by mouth every 6 (six) hours as needed.   triamcinolone 0.025 % cream Commonly known as: KENALOG Apply topically.   Vitamin D3 10 MCG (400 UNIT) tablet Take 400 Units by mouth daily.        Allergies:  Allergies  Allergen Reactions    Metoprolol     Caused chest pain  Other Reaction(s): Other (See Comments)     Social History:  reports that he has never smoked. He has never used smokeless tobacco. He reports that he does not drink alcohol and does not use drugs.   Physical Exam: BP (!) 156/71   Pulse (!) 102   Ht 5\' 9"  (1.753 m)   Wt 255 lb (115.7 kg)   BMI 37.66 kg/m   Constitutional:  Alert and oriented, No acute distress. HEENT: [place] AT, moist mucus membranes.  Trachea midline, no masses. GU: He had a large indurated mass at the base of his scrotum, measuring 5 cm by 3 cm. There  was one area about golf ball size that had some fluctuance. There is no overlying necrosis and was not able to induce any spontaneous drainage with compression.  No crepitus.  His scrotum is otherwise unremarkable.  Does not appear to involve the anus. Neurologic: Grossly intact, no focal deficits, moving all 4 extremities. Psychiatric: Normal mood and affect.  Assessment & Plan:    1. Scrotal/Perineal abscess - Managed medically and is improving but there is an area of central fluctuance  - He does not appear septic or toxic - He has had some improvement in the swelling - It does appear that it needs to be drained/ explored given its proximity to his anus/ rectum and bulbar urethra as well as its overall size, I do think it is prudent to proceed with imaging to rule out underlying conditions or involvement of other structures, as well as rule out Fournier gangrene, given that he is a diabetic, although this would be a relatively unusual insidious onset - We discussed sending him to the emergency room for I&D later once he spent 8 hours NPO versus scheduling this urgently on Monday, given that he has had some improvement. - It seems clinically well if his CT scan is reasonable, we can wait until Monday, but strict warning precautions were given about if and when to go to the emergency room, including signs of systemic infection, worsening  pain, or if the swelling starts to worsen rather than continue to improve. - He is agreeable to this plan and will plan for I&D in the OR on Monday.  - Risk and benefits of the procedure were discussed including the possible need for admission if the abscess is worse than anticipated, need for daily packing, need for re-exploration, damage to surrounding structures, bleeding amongst others. All questions were answered. He and his wife are agreeable to this plan.   Return for CT abdomen pelvis.   [facility] [address] [place], [place] [address] [phone]

## 2023-01-20 ENCOUNTER — Other Ambulatory Visit: Payer: Self-pay

## 2023-01-20 ENCOUNTER — Encounter
Admission: RE | Disposition: A | Discharge status: Home / Self Care | Payer: Self-pay | Source: Home / Self Care | Attending: Urology

## 2023-01-20 ENCOUNTER — Ambulatory Visit
Admission: RE | Admit: 2023-01-20 | Discharge: 2023-01-20 | Disposition: A | Discharge status: Home / Self Care | Payer: Medicare Other | Attending: Urology | Admitting: Urology

## 2023-01-20 ENCOUNTER — Ambulatory Visit: Payer: Medicare Other | Admitting: Anesthesiology

## 2023-01-20 ENCOUNTER — Encounter: Payer: Self-pay | Admitting: Urology

## 2023-01-20 DIAGNOSIS — Z794 Long term (current) use of insulin: Secondary | ICD-10-CM | POA: Diagnosis not present

## 2023-01-20 DIAGNOSIS — I10 Essential (primary) hypertension: Secondary | ICD-10-CM | POA: Insufficient documentation

## 2023-01-20 DIAGNOSIS — Z01818 Encounter for other preprocedural examination: Secondary | ICD-10-CM

## 2023-01-20 DIAGNOSIS — G4730 Sleep apnea, unspecified: Secondary | ICD-10-CM | POA: Diagnosis not present

## 2023-01-20 DIAGNOSIS — N400 Enlarged prostate without lower urinary tract symptoms: Secondary | ICD-10-CM | POA: Diagnosis not present

## 2023-01-20 DIAGNOSIS — L02215 Cutaneous abscess of perineum: Secondary | ICD-10-CM | POA: Diagnosis present

## 2023-01-20 DIAGNOSIS — Z7984 Long term (current) use of oral hypoglycemic drugs: Secondary | ICD-10-CM | POA: Diagnosis not present

## 2023-01-20 DIAGNOSIS — E119 Type 2 diabetes mellitus without complications: Secondary | ICD-10-CM | POA: Insufficient documentation

## 2023-01-20 DIAGNOSIS — K219 Gastro-esophageal reflux disease without esophagitis: Secondary | ICD-10-CM | POA: Insufficient documentation

## 2023-01-20 DIAGNOSIS — B957 Other staphylococcus as the cause of diseases classified elsewhere: Secondary | ICD-10-CM | POA: Diagnosis not present

## 2023-01-20 DIAGNOSIS — N492 Inflammatory disorders of scrotum: Secondary | ICD-10-CM

## 2023-01-20 DIAGNOSIS — Z01810 Encounter for preprocedural cardiovascular examination: Secondary | ICD-10-CM

## 2023-01-20 HISTORY — PX: INCISION AND DRAINAGE ABSCESS: SHX5864

## 2023-01-20 HISTORY — PX: SCROTAL EXPLORATION: SHX2386

## 2023-01-20 LAB — GLUCOSE, CAPILLARY
Glucose-Capillary: 128 mg/dL — ABNORMAL HIGH (ref 70–99)
Glucose-Capillary: 153 mg/dL — ABNORMAL HIGH (ref 70–99)
Glucose-Capillary: 169 mg/dL — ABNORMAL HIGH (ref 70–99)

## 2023-01-20 SURGERY — EXPLORATION, SCROTUM
Anesthesia: General

## 2023-01-20 MED ORDER — DEXAMETHASONE SODIUM PHOSPHATE 10 MG/ML IJ SOLN
INTRAMUSCULAR | Status: DC | PRN
  Administered 2023-01-20: 5 mg via INTRAVENOUS

## 2023-01-20 MED ORDER — FENTANYL CITRATE (PF) 100 MCG/2ML IJ SOLN
INTRAMUSCULAR | Status: DC | PRN
  Administered 2023-01-20: 25 ug via INTRAVENOUS

## 2023-01-20 MED ORDER — CEFAZOLIN SODIUM-DEXTROSE 2-4 GM/100ML-% IV SOLN
2.0000 g | INTRAVENOUS | Status: AC
  Administered 2023-01-20: 2 g via INTRAVENOUS

## 2023-01-20 MED ORDER — OXYCODONE-ACETAMINOPHEN 5-325 MG PO TABS
ORAL_TABLET | ORAL | Status: AC
  Filled 2023-01-20: qty 1

## 2023-01-20 MED ORDER — MIDAZOLAM HCL 2 MG/2ML IJ SOLN
INTRAMUSCULAR | Status: AC
  Filled 2023-01-20: qty 2

## 2023-01-20 MED ORDER — KETAMINE HCL 50 MG/5ML IJ SOSY
PREFILLED_SYRINGE | INTRAMUSCULAR | Status: AC
  Filled 2023-01-20: qty 5

## 2023-01-20 MED ORDER — SODIUM CHLORIDE 0.9 % IV SOLN: INTRAVENOUS | Status: DC

## 2023-01-20 MED ORDER — CHLORHEXIDINE GLUCONATE 0.12 % MT SOLN
OROMUCOSAL | Status: AC
  Filled 2023-01-20: qty 15

## 2023-01-20 MED ORDER — CEFAZOLIN SODIUM-DEXTROSE 2-4 GM/100ML-% IV SOLN
INTRAVENOUS | Status: AC
  Filled 2023-01-20: qty 100

## 2023-01-20 MED ORDER — BUPIVACAINE HCL (PF) 0.5 % IJ SOLN
INTRAMUSCULAR | Status: AC
  Filled 2023-01-20: qty 30

## 2023-01-20 MED ORDER — FENTANYL CITRATE (PF) 100 MCG/2ML IJ SOLN: 25.0000 ug | INTRAMUSCULAR | Status: DC | PRN

## 2023-01-20 MED ORDER — ONDANSETRON HCL 4 MG/2ML IJ SOLN
INTRAMUSCULAR | Status: AC
  Filled 2023-01-20: qty 2

## 2023-01-20 MED ORDER — ONDANSETRON HCL 4 MG/2ML IJ SOLN
INTRAMUSCULAR | Status: DC | PRN
  Administered 2023-01-20: 4 mg via INTRAVENOUS

## 2023-01-20 MED ORDER — ACETAMINOPHEN 10 MG/ML IV SOLN: 1000.0000 mg | Freq: Once | INTRAVENOUS | Status: DC | PRN

## 2023-01-20 MED ORDER — 0.9 % SODIUM CHLORIDE (POUR BTL) OPTIME
TOPICAL | Status: DC | PRN
  Administered 2023-01-20: 1000 mL

## 2023-01-20 MED ORDER — DEXAMETHASONE SODIUM PHOSPHATE 10 MG/ML IJ SOLN
INTRAMUSCULAR | Status: AC
  Filled 2023-01-20: qty 1

## 2023-01-20 MED ORDER — PROPOFOL 10 MG/ML IV BOLUS
INTRAVENOUS | Status: DC | PRN
  Administered 2023-01-20: 150 mg via INTRAVENOUS

## 2023-01-20 MED ORDER — ONDANSETRON HCL 4 MG/2ML IJ SOLN: 4.0000 mg | Freq: Once | INTRAMUSCULAR | Status: DC | PRN

## 2023-01-20 MED ORDER — OXYCODONE HCL 5 MG PO TABS
ORAL_TABLET | ORAL | Status: AC
  Filled 2023-01-20: qty 1

## 2023-01-20 MED ORDER — OXYCODONE HCL 5 MG/5ML PO SOLN: 5.0000 mg | Freq: Once | ORAL | Status: AC | PRN

## 2023-01-20 MED ORDER — LIDOCAINE HCL (PF) 2 % IJ SOLN
INTRAMUSCULAR | Status: AC
  Filled 2023-01-20: qty 5

## 2023-01-20 MED ORDER — PHENYLEPHRINE HCL (PRESSORS) 10 MG/ML IV SOLN
INTRAVENOUS | Status: DC | PRN
  Administered 2023-01-20: 100 ug via INTRAVENOUS

## 2023-01-20 MED ORDER — ACETAMINOPHEN 10 MG/ML IV SOLN
INTRAVENOUS | Status: AC
  Filled 2023-01-20: qty 100

## 2023-01-20 MED ORDER — OXYCODONE HCL 5 MG PO TABS
5.0000 mg | ORAL_TABLET | Freq: Once | ORAL | Status: AC | PRN
  Administered 2023-01-20: 5 mg via ORAL

## 2023-01-20 MED ORDER — OXYCODONE-ACETAMINOPHEN 5-325 MG PO TABS
1.0000 | ORAL_TABLET | ORAL | Status: DC | PRN
  Administered 2023-01-20: 1 via ORAL

## 2023-01-20 MED ORDER — MIDAZOLAM HCL 2 MG/2ML IJ SOLN
INTRAMUSCULAR | Status: DC | PRN
  Administered 2023-01-20: 1 mg via INTRAVENOUS

## 2023-01-20 MED ORDER — FENTANYL CITRATE (PF) 100 MCG/2ML IJ SOLN
INTRAMUSCULAR | Status: AC
  Filled 2023-01-20: qty 2

## 2023-01-20 MED ORDER — KETAMINE HCL 10 MG/ML IJ SOLN
INTRAMUSCULAR | Status: DC | PRN
Start: 2023-01-20 — End: 2023-01-20
  Administered 2023-01-20: 30 mg via INTRAVENOUS
  Administered 2023-01-20: 20 mg via INTRAVENOUS

## 2023-01-20 MED ORDER — OXYCODONE-ACETAMINOPHEN 5-325 MG PO TABS: 1.0000 | ORAL_TABLET | ORAL | 0 refills | Status: DC | PRN

## 2023-01-20 MED ORDER — ORAL CARE MOUTH RINSE: 15.0000 mL | Freq: Once | OROMUCOSAL | Status: AC

## 2023-01-20 MED ORDER — BUPIVACAINE HCL 0.5 % IJ SOLN
INTRAMUSCULAR | Status: DC | PRN
  Administered 2023-01-20: 10 mL

## 2023-01-20 MED ORDER — ACETAMINOPHEN 10 MG/ML IV SOLN
INTRAVENOUS | Status: DC | PRN
  Administered 2023-01-20: 1000 mg via INTRAVENOUS

## 2023-01-20 MED ORDER — PROPOFOL 10 MG/ML IV BOLUS
INTRAVENOUS | Status: AC
  Filled 2023-01-20: qty 20

## 2023-01-20 MED ORDER — CHLORHEXIDINE GLUCONATE 0.12 % MT SOLN
15.0000 mL | Freq: Once | OROMUCOSAL | Status: AC
  Administered 2023-01-20: 15 mL via OROMUCOSAL

## 2023-01-20 MED ORDER — LIDOCAINE HCL (PF) 1 % IJ SOLN
INTRAMUSCULAR | Status: AC
  Filled 2023-01-20: qty 30

## 2023-01-20 SURGICAL SUPPLY — 38 items
ADH SKN CLS APL DERMABOND .7 (GAUZE/BANDAGES/DRESSINGS) ×1
APL PRP STRL LF DISP 70% ISPRP (MISCELLANEOUS)
BNDG CMPR 75X21 PLY HI ABS (MISCELLANEOUS) ×1
CHLORAPREP W/TINT 26 (MISCELLANEOUS) ×1 IMPLANT
DERMABOND ADVANCED .7 DNX12 (GAUZE/BANDAGES/DRESSINGS) ×1 IMPLANT
DRAIN PENROSE 12X.25 LTX STRL (MISCELLANEOUS) ×1 IMPLANT
DRAPE LAPAROTOMY 77X122 PED (DRAPES) ×1 IMPLANT
DRSG GAUZE FLUFF 36X18 (GAUZE/BANDAGES/DRESSINGS) ×1 IMPLANT
ELECT REM PT RETURN 9FT ADLT (ELECTROSURGICAL) ×1
ELECTRODE REM PT RTRN 9FT ADLT (ELECTROSURGICAL) ×1 IMPLANT
GAUZE 4X4 16PLY ~~LOC~~+RFID DBL (SPONGE) ×1 IMPLANT
GAUZE SPONGE 4X4 12PLY STRL (GAUZE/BANDAGES/DRESSINGS) ×1 IMPLANT
GAUZE STRETCH 2X75IN STRL (MISCELLANEOUS) ×1 IMPLANT
GLOVE BIO SURGEON STRL SZ 6.5 (GLOVE) ×1 IMPLANT
GLOVE BIO SURGEON STRL SZ7 (GLOVE) ×1 IMPLANT
GOWN STRL REUS W/ TWL LRG LVL3 (GOWN DISPOSABLE) ×2 IMPLANT
GOWN STRL REUS W/TWL LRG LVL3 (GOWN DISPOSABLE) ×2
KIT TURNOVER KIT A (KITS) ×1 IMPLANT
LABEL OR SOLS (LABEL) ×1 IMPLANT
MANIFOLD NEPTUNE II (INSTRUMENTS) ×1 IMPLANT
NDL HYPO 25X1 1.5 SAFETY (NEEDLE) ×1 IMPLANT
NEEDLE HYPO 25X1 1.5 SAFETY (NEEDLE) ×1 IMPLANT
NS IRRIG 500ML POUR BTL (IV SOLUTION) ×1 IMPLANT
PACK BASIN MINOR ARMC (MISCELLANEOUS) ×1 IMPLANT
PAD ABD DERMACEA PRESS 5X9 (GAUZE/BANDAGES/DRESSINGS) IMPLANT
SOL PREP PVP 2OZ (MISCELLANEOUS) ×1
SOLUTION PREP PVP 2OZ (MISCELLANEOUS) ×1 IMPLANT
SUPPORETR ATHLETIC LG (MISCELLANEOUS) ×1 IMPLANT
SUPPORTER ATHLETIC LG (MISCELLANEOUS)
SUT ETHILON 3-0 FS-10 30 BLK (SUTURE)
SUT VIC AB 3-0 SH 27 (SUTURE)
SUT VIC AB 3-0 SH 27X BRD (SUTURE) ×2 IMPLANT
SUT VIC AB 4-0 SH 27 (SUTURE) ×1
SUT VIC AB 4-0 SH 27XANBCTRL (SUTURE) ×1 IMPLANT
SUTURE EHLN 3-0 FS-10 30 BLK (SUTURE) ×1 IMPLANT
SYR 10ML LL (SYRINGE) ×1 IMPLANT
TRAP FLUID SMOKE EVACUATOR (MISCELLANEOUS) ×1 IMPLANT
WATER STERILE IRR 500ML POUR (IV SOLUTION) ×1 IMPLANT

## 2023-01-20 NOTE — Anesthesia Postprocedure Evaluation (Signed)
Anesthesia Post Note  Patient: Joseph FRESCH Sr.  Procedure(s) Performed: SCROTUM EXPLORATION INCISION AND DRAINAGE ABSCESS OF SCROTUM/PERINEAL  Patient location during evaluation: PACU Anesthesia Type: General Level of consciousness: awake and alert Pain management: pain level controlled Vital Signs Assessment: post-procedure vital signs reviewed and stable Respiratory status: spontaneous breathing, nonlabored ventilation, respiratory function stable and patient connected to nasal cannula oxygen Cardiovascular status: blood pressure returned to baseline and stable Postop Assessment: no apparent nausea or vomiting Anesthetic complications: no   No notable events documented.   Last Vitals:  Vitals:   01/20/23 0900 01/20/23 0914  BP: (!) 122/58 (!) 149/55  Pulse: 78 78  Resp: 13   Temp: (!) 36.4 C 36.6 C  SpO2: 98% 97%    Last Pain:  Vitals:   01/20/23 0914  TempSrc: Temporal  PainSc: 3                  Corinda Gubler

## 2023-01-20 NOTE — Discharge Instructions (Addendum)
The base of your scrotum/perineum.  This needs to be packed daily.  I do anticipate you will have some bleeding from the wound for the first several days.  This is normal.  Change packing as needed if it becomes saturated.  Complete your course of antibiotics.  Vanna Scotland, MD AMBULATORY SURGERY  DISCHARGE INSTRUCTIONS   The drugs that you were given will stay in your system until tomorrow so for the next 24 hours you should not:  Drive an automobile Make any legal decisions Drink any alcoholic beverage   You may resume regular meals tomorrow.  Today it is better to start with liquids and gradually work up to solid foods.  You may eat anything you prefer, but it is better to start with liquids, then soup and crackers, and gradually work up to solid foods.   Please notify your doctor immediately if you have any unusual bleeding, trouble breathing, redness and pain at the surgery site, drainage, fever, or pain not relieved by medication.   Your post-operative visit with Dr.                                     is: Date:                        Time:    Please call to schedule your post-operative visit.  Additional Instructions:

## 2023-01-20 NOTE — Interval H&P Note (Signed)
History and Physical Interval Note:  01/20/2023 7:26 AM  Joseph Priest Sr.  has presented today for surgery, with the diagnosis of Perineal/Scrotal Abscess.  The various methods of treatment have been discussed with the patient and family. After consideration of risks, benefits and other options for treatment, the patient has consented to  Procedure(s): SCROTUM EXPLORATION (N/A) INCISION AND DRAINAGE ABSCESS OF SCROTUM/PERINEAL (N/A) as a surgical intervention.  The patient's history has been reviewed, patient examined, no change in status, stable for surgery.  I have reviewed the patient's chart and labs.  Questions were answered to the patient's satisfaction.    RRR CTAB Vanna Scotland

## 2023-01-20 NOTE — Op Note (Signed)
Date of procedure: 01/20/23  Preoperative diagnosis:  Scrotal/perineal abscess  Postoperative diagnosis:  Same as above  Procedure: Incision and drainage of perineal/scrotal abscess  Surgeon: Vanna Scotland, MD  Anesthesia: General  Complications: None  Intraoperative findings: Golf ball sized abscess with copious purulence with fairly large rind, cavity measuring 6 x 3 x 4 cm.  All loculations were broken up, wound culture sent.  EBL: 20 cc  Specimens: Wound culture  Drains: None  Indication: Joseph Priest Sr. is a 77 y.o. patient with .  After reviewing the management options for treatment, he elected to proceed with the above surgical procedure(s). We have discussed the potential benefits and risks of the procedure, side effects of the proposed treatment, the likelihood of the patient achieving the goals of the procedure, and any potential problems that might occur during the procedure or recuperation. Informed consent has been obtained.  Description of procedure:  The patient was taken to the operating room and general anesthesia was induced.  The patient was placed in the dorsal lithotomy position, prepped and draped in the usual sterile fashion, and preoperative antibiotics were administered. A preoperative time-out was performed.   A #15 blade was used to incise the peritoneum.  Notably today there were new skin changes with some slight  desquamative changes overlying the most tense area of the perineum.  An approximately 3 cm long incision was made in the perineum at which time a copious amount of purulent fluid was drained.  Wound cultures were taken from the bed of this.  I then extended the incision superiorly towards the scrotum another centimeter for a total of 4 cm incision is gentle probing bluntly indicated that the wound tracked in this direction.  The bed itself was relatively deep measuring the above incised.  I then used Bovie electrocautery on the wound edges  to achieve reasonable hemostasis.  The wound was then irrigated copiously and dried.  It was packed using a small Kerlix scrotal fluffs and an ABD pad.  Mesh panties were applied.  He was then reversed from anesthesia and taken to the PACU in stable condition.  Plan: Wound will need to be packed daily.  His wife feels comfortable doing this and they also have friends/family that her first responders to assist with any wound care needs.  Complete course of antibiotics.  Vanna Scotland, M.D.

## 2023-01-20 NOTE — Progress Notes (Signed)
Brief Urology Progress Note  I joined the patient in the PACU postoperatively today due to concerns for bleeding wound requiring repacking x2. On arrival, I removed his packing in its entirety and noticed brisk dripping blood from the wound margins.  I did not appreciate any pulsatile or spurting bleeding.  I repacked the wound with sterile 4 x 4 gauze pads and applied pressure.  Patient tolerated well.  I returned to the PACU about 2 hours later.  Bleeding and slowed somewhat, but was still dripping more briskly than I would have liked.  I again removed the wound packing in its entirety, thoroughly inspected the wound bed, and again noted bleeding only from the wound margins.  At this point, I elected to treat the wound margins with Arista powder; stop the bleeding entirely.  I repacked the wound with dry 4 x 4 gauze pads and covered his wound with an ABD pad.  Patient again tolerated well.  Carman Ching, PA-C 01/20/23  4:01 PM

## 2023-01-20 NOTE — Anesthesia Preprocedure Evaluation (Signed)
Anesthesia Evaluation  Patient identified by MRN, date of birth, ID band Patient awake    Reviewed: Allergy & Precautions, NPO status , Patient's Chart, lab work & pertinent test results  History of Anesthesia Complications Negative for: history of anesthetic complications  Airway Mallampati: IV  TM Distance: <3 FB Neck ROM: Full    Dental  (+) Teeth Intact   Pulmonary neg pulmonary ROS, sleep apnea , neg COPD, Patient abstained from smoking.Not current smoker "Severe OSA" per patient. Does not use cpap   Pulmonary exam normal breath sounds clear to auscultation       Cardiovascular Exercise Tolerance: Poor METShypertension, Pt. on medications (-) CAD and (-) Past MI + dysrhythmias Atrial Fibrillation  Rhythm:Regular Rate:Normal - Systolic murmurs EKG today, sinus rhythm   Neuro/Psych negative neurological ROS  negative psych ROS   GI/Hepatic ,GERD  Medicated and Controlled,,(+)     (-) substance abuse    Endo/Other  diabetes, Well Controlled, Type 2, Insulin Dependent    Renal/GU negative Renal ROS     Musculoskeletal   Abdominal  (+) + obese  Peds  Hematology   Anesthesia Other Findings Past Medical History: No date: BPH (benign prostatic hyperplasia) No date: Diabetes mellitus without complication (HCC) No date: GERD (gastroesophageal reflux disease) No date: History of kidney stones No date: Hypertension No date: Sleep apnea  Reproductive/Obstetrics                              Anesthesia Physical Anesthesia Plan  ASA: 3  Anesthesia Plan: General   Post-op Pain Management: Ofirmev IV (intra-op)*   Induction: Intravenous  PONV Risk Score and Plan: 2 and Ondansetron, Dexamethasone and Treatment may vary due to age or medical condition  Airway Management Planned: LMA  Additional Equipment: None  Intra-op Plan:   Post-operative Plan: Extubation in OR  Informed  Consent: I have reviewed the patients History and Physical, chart, labs and discussed the procedure including the risks, benefits and alternatives for the proposed anesthesia with the patient or authorized representative who has indicated his/her understanding and acceptance.     Dental advisory given  Plan Discussed with: CRNA and Surgeon  Anesthesia Plan Comments: (Plan for LMA, GETA backup. Difficult airway equipment in room and available ready to use.  Discussed risks of anesthesia with patient, including PONV, sore throat, lip/dental/eye damage. Rare risks discussed as well, such as cardiorespiratory and neurological sequelae, and allergic reactions. Discussed the role of CRNA in patient's perioperative care. Patient understands.)         Anesthesia Quick Evaluation

## 2023-01-20 NOTE — Transfer of Care (Signed)
Immediate Anesthesia Transfer of Care Note  Patient: Joseph Priest Sr.  Procedure(s) Performed: Sande Brothers EXPLORATION INCISION AND DRAINAGE ABSCESS OF SCROTUM/PERINEAL  Patient Location: PACU  Anesthesia Type:General  Level of Consciousness: awake  Airway & Oxygen Therapy: Patient Spontanous Breathing and Patient connected to face mask oxygen  Post-op Assessment: Report given to RN and Post -op Vital signs reviewed and stable  Post vital signs: Reviewed and stable  Last Vitals:  Vitals Value Taken Time  BP 83/43 01/20/23 0823  Temp 36.5 C 01/20/23 0818  Pulse 72 01/20/23 0824  Resp 11 01/20/23 0824  SpO2 100 % 01/20/23 0824  Vitals shown include unfiled device data.  Last Pain:  Vitals:   01/20/23 0711  TempSrc: Temporal  PainSc: 7          Complications: No notable events documented.

## 2023-01-23 ENCOUNTER — Telehealth: Payer: Self-pay

## 2023-01-23 MED ORDER — CIPROFLOXACIN HCL 250 MG PO TABS: 250.0000 mg | ORAL_TABLET | Freq: Two times a day (BID) | ORAL | 0 refills | Status: DC

## 2023-01-23 NOTE — Telephone Encounter (Signed)
Spoke with Dr. Apolinar Junes, prelim culture showed bacteria growing, placed on Cipro 250mg  BID x 7 days. Medication sent [facility] and wound appt schedule with sam next week.

## 2023-01-27 ENCOUNTER — Encounter: Payer: Self-pay | Admitting: Urology

## 2023-01-30 ENCOUNTER — Ambulatory Visit (INDEPENDENT_AMBULATORY_CARE_PROVIDER_SITE_OTHER): Payer: Medicare Other | Admitting: Physician Assistant

## 2023-01-30 ENCOUNTER — Ambulatory Visit: Payer: Medicare Other | Admitting: Physician Assistant

## 2023-01-30 VITALS — BP 114/64 | HR 97 | Ht 69.0 in | Wt 251.0 lb

## 2023-01-30 DIAGNOSIS — Z87438 Personal history of other diseases of male genital organs: Secondary | ICD-10-CM

## 2023-01-30 DIAGNOSIS — N492 Inflammatory disorders of scrotum: Secondary | ICD-10-CM

## 2023-01-30 DIAGNOSIS — Z09 Encounter for follow-up examination after completed treatment for conditions other than malignant neoplasm: Secondary | ICD-10-CM

## 2023-01-30 NOTE — Progress Notes (Signed)
Patient presented to clinic today for postop wound check.  He underwent I&D of a perineal/scrotal abscess with Dr. Apolinar Junes 10 days ago.    He is accompanied today by his wife, who contributes to HPI.  They continue to pack the wound once daily and has noticed that it excepts less packing now than it did previously.  He was having some discomfort due to pressure from the wound over the past couple of days, but this has been significantly improved by decreasing the volume of packing.  On physical exam, his wound is lined with healthy granulation tissue with edema.  I repacked the wound with half of a 4 x 4 gauze pad moistened with sterile saline and he tolerated this well.  I counseled him to continue daily wet-to-dry dressing changes until the wound becomes shallow enough that it would no longer accepts packing.  We discussed that he is okay to resume normal activities including bathing and driving as long as he is not taking narcotic pain meds.  All questions answered.  He expressed understanding.  Return in about 6 weeks (around 03/13/2023) for Postop f/u with Dr. Apolinar Junes.

## 2023-01-31 ENCOUNTER — Ambulatory Visit: Payer: Self-pay | Admitting: Urology

## 2023-02-07 LAB — AEROBIC/ANAEROBIC CULTURE W GRAM STAIN (SURGICAL/DEEP WOUND)

## 2023-03-11 ENCOUNTER — Encounter: Payer: Self-pay | Admitting: Urology

## 2023-03-11 ENCOUNTER — Ambulatory Visit: Payer: Medicare Other | Admitting: Urology

## 2023-03-11 VITALS — BP 146/91 | HR 91

## 2023-03-11 DIAGNOSIS — Z87438 Personal history of other diseases of male genital organs: Secondary | ICD-10-CM

## 2023-03-11 DIAGNOSIS — N492 Inflammatory disorders of scrotum: Secondary | ICD-10-CM

## 2023-03-11 DIAGNOSIS — Z09 Encounter for follow-up examination after completed treatment for conditions other than malignant neoplasm: Secondary | ICD-10-CM

## 2023-03-11 NOTE — Progress Notes (Signed)
I,[name],acting as a scribe for Joseph Scotland, MD.,have documented all relevant documentation on the behalf of Joseph Scotland, MD,as directed by  Joseph Scotland, MD while in the presence of Joseph Scotland, MD.  03/11/2023 2:52 PM   Joseph Priest Sr. May 21, 1945 [id number]  Referring provider: [name], MD [phone] [facility] [address] [facility] [place],  [place] [address]  Chief Complaint  Patient presents with   Other    HPI: 77 year-old male with a personal history of large perineal/ scrotal abscess presents today status post I&D follow-up.   He has competed his course of antibiotics.  He mentions that when his wife was changing his dressings there were some spots she would touch that were very painful and appeared red.  He thinks he may have scratched the area and caused it to bleed.   PMH: Past Medical History:  Diagnosis Date   BPH (benign prostatic hyperplasia)    Diabetes mellitus without complication (HCC)    GERD (gastroesophageal reflux disease)    History of kidney stones    Hypertension    Sleep apnea     Surgical History: Past Surgical History:  Procedure Laterality Date   CHOLECYSTECTOMY     COLONOSCOPY N/A 04/02/2017   Procedure: COLONOSCOPY;  Surgeon: Scot Jun, MD;  Location: [facility] ENDOSCOPY;  Service: Endoscopy;  Laterality: N/A;   COLONOSCOPY WITH PROPOFOL N/A 04/02/2017   Procedure: COLONOSCOPY WITH PROPOFOL;  Surgeon: Scot Jun, MD;  Location: [facility] ENDOSCOPY;  Service: Endoscopy;  Laterality: N/A;   INCISION AND DRAINAGE ABSCESS N/A 01/20/2023   Procedure: INCISION AND DRAINAGE ABSCESS OF SCROTUM/PERINEAL;  Surgeon: Joseph Scotland, MD;  Location: [facility] ORS;  Service: Urology;  Laterality: N/A;   nasal somnoplasty     ROTATOR CUFF REPAIR Left    SCROTAL EXPLORATION N/A 01/20/2023   Procedure: SCROTUM EXPLORATION;  Surgeon: Joseph Scotland, MD;  Location: [facility] ORS;  Service: Urology;  Laterality: N/A;    Home  Medications:  Allergies as of 03/11/2023       Reactions   Metoprolol    Caused chest pain Other Reaction(s): Other (See Comments)        Medication List        Accurate as of March 11, 2023  2:52 PM. If you have any questions, ask your nurse or doctor.          STOP taking these medications    ciprofloxacin 250 MG tablet Commonly known as: Cipro Stopped by: Joseph Hensley   hydrALAZINE 25 MG tablet Commonly known as: APRESOLINE Stopped by: Joseph Hensley   oxyCODONE-acetaminophen 5-325 MG tablet Commonly known as: Percocet Stopped by: Joseph Hensley   Procysbi 300 MG Pack Generic drug: Cysteamine Bitartrate Stopped by: Joseph Hensley       TAKE these medications    acetaminophen 500 MG tablet Commonly known as: TYLENOL Take 500 mg by mouth as needed.   amLODipine 10 MG tablet Commonly known as: NORVASC Take 10 mg by mouth daily.   aspirin EC 81 MG tablet Take 81 mg by mouth daily.   fenofibrate 145 MG tablet Commonly known as: TRICOR Take 145 mg by mouth daily.   furosemide 20 MG tablet Commonly known as: LASIX Take 20 mg by mouth daily. As needed for up to 3 days for leg swelling   gabapentin 400 MG capsule Commonly known as: NEURONTIN Take 400 mg by mouth 2 (two) times daily.   GE100 Blood Glucose Test test strip Generic drug: glucose blood  USE TO TEST FOUR TIMES DAILY AS DIRECTED.   HumaLOG KwikPen 100 UNIT/ML KwikPen Generic drug: insulin lispro Inject into the skin.   hydrochlorothiazide 25 MG tablet Commonly known as: HYDRODIURIL Take 25 mg by mouth daily.   insulin glargine 100 UNIT/ML injection Commonly known as: LANTUS Inject 75 Units into the skin 2 (two) times daily.   insulin lispro protamine-lispro (50-50) 100 UNIT/ML Susp injection Commonly known as: HUMALOG 50/50 MIX Inject into the skin 3 (three) times daily before meals. Take 35 units before breakfast,  35units before lunch, and 35units before dinner    lisinopril 40 MG tablet Commonly known as: ZESTRIL Take 40 mg by mouth daily.   lovastatin 20 MG tablet Commonly known as: MEVACOR Take 20 mg by mouth at bedtime.   metFORMIN 1000 MG tablet Commonly known as: GLUCOPHAGE Take 1,000 mg by mouth 2 (two) times daily with a meal.   pantoprazole 40 MG tablet Commonly known as: PROTONIX Take 40 mg by mouth daily.   Pen Needles 3/16" 31G X 5 MM Misc USE TO TEST 5 TIMES DAILY   Global Ease Inject Pen Needles 31G X 8 MM Misc Generic drug: Insulin Pen Needle Inject into the skin.   triamcinolone 0.025 % cream Commonly known as: KENALOG Apply topically.   Vitamin D3 10 MCG (400 UNIT) tablet Take 400 Units by mouth daily.        Allergies:  Allergies  Allergen Reactions   Metoprolol     Caused chest pain  Other Reaction(s): Other (See Comments)    Social History:  reports that he has never smoked. He has never used smokeless tobacco. He reports that he does not drink alcohol and does not use drugs.   Physical Exam: BP (!) 146/91   Pulse 91   Constitutional:  Alert and oriented, No acute distress. HEENT: [place] AT, moist mucus membranes.  Trachea midline, no masses. GU: Well-healed perineal incision. Very subtle amount of erythema at the superior pole of this closest to the base of his scrotum; no fluctuance, evidence of sinus tract, or ongoing infection. No indication for further antibiotics or intervention. Neurologic: Grossly intact, no focal deficits, moving all 4 extremities. Psychiatric: Normal mood and affect.   Assessment & Plan:    1. Scrotal/Perineal abscess  - S/p I&D. Area healing well. No indication for further antibiotics or intervention.   - Explained he has some scar tissue present. Also as men age the scrotum muscles may become lax. Encourage supportive/ compression underwear.   Return if symptoms worsen or fail to improve.  I have reviewed the above documentation for accuracy and completeness, and I  agree with the above.   Joseph Hensley, MDEast[facility] [address], [address] [place], [place] [address] [phone]

## 2023-03-20 ENCOUNTER — Emergency Department
Admission: EM | Admit: 2023-03-20 | Discharge: 2023-03-20 | Disposition: A | Discharge status: Home / Self Care | Payer: Medicare Other | Attending: Emergency Medicine | Admitting: Emergency Medicine

## 2023-03-20 ENCOUNTER — Emergency Department: Payer: Medicare Other

## 2023-03-20 ENCOUNTER — Other Ambulatory Visit: Payer: Self-pay

## 2023-03-20 DIAGNOSIS — Y9301 Activity, walking, marching and hiking: Secondary | ICD-10-CM | POA: Insufficient documentation

## 2023-03-20 DIAGNOSIS — W101XXA Fall (on)(from) sidewalk curb, initial encounter: Secondary | ICD-10-CM | POA: Diagnosis not present

## 2023-03-20 DIAGNOSIS — R200 Anesthesia of skin: Secondary | ICD-10-CM | POA: Diagnosis not present

## 2023-03-20 DIAGNOSIS — M542 Cervicalgia: Secondary | ICD-10-CM | POA: Insufficient documentation

## 2023-03-20 DIAGNOSIS — W19XXXA Unspecified fall, initial encounter: Secondary | ICD-10-CM

## 2023-03-20 DIAGNOSIS — M25521 Pain in right elbow: Secondary | ICD-10-CM | POA: Insufficient documentation

## 2023-03-20 NOTE — ED Triage Notes (Signed)
Pt was walking to his car and had mechanical fall while stepping down from a curb. Pt hit his head on the cement and his right arm. Pt is on blood thinners with hx of multiple heart caths. Pt has deformity to his right elbow

## 2023-03-20 NOTE — ED Provider Notes (Signed)
[facility] Provider Note   Event Date/Time   First MD Initiated Contact with Patient 03/20/23 1353     (approximate) History  Fall and Arm Injury  HPI Joseph CHIZEK Sr. is a 77 y.o. male who presents after mechanical fall from standing concerned of left-sided neck pain with right elbow pain.  Patient states that his difficulty to range the elbow without significant range of motion deficit.  Patient also states that he is having mild numbness to this right hand that has started to resolve ROS: Patient currently denies any vision changes, tinnitus, difficulty speaking, facial droop, sore throat, chest pain, shortness of breath, abdominal pain, nausea/vomiting/diarrhea, dysuria, or paresthesias in any extremity   Physical Exam  Triage Vital Signs: ED Triage Vitals  Encounter Vitals Group     BP 03/20/23 1208 (!) 176/55     Systolic BP Percentile --      Diastolic BP Percentile --      Pulse Rate 03/20/23 1208 73     Resp 03/20/23 1208 16     Temp 03/20/23 1208 98.8 F (37.1 C)     Temp Source 03/20/23 1208 Oral     SpO2 03/20/23 1208 98 %     Weight 03/20/23 1209 260 lb (117.9 kg)     Height 03/20/23 1209 5\' 9"  (1.753 m)     Head Circumference --      Peak Flow --      Pain Score 03/20/23 1208 5     Pain Loc --      Pain Education --      Exclude from Growth Chart --    Most recent vital signs: Vitals:   03/20/23 1208  BP: (!) 176/55  Pulse: 73  Resp: 16  Temp: 98.8 F (37.1 C)  SpO2: 98%   General: Awake, oriented x4. CV:  Good peripheral perfusion.  Resp:  Normal effort.  Abd:  No distention.  Other:  Elderly obese Caucasian male resting comfortably in no acute distress.  Swelling appreciated to the right elbow ED Results / Procedures / Treatments  RADIOLOGY ED MD interpretation: CT of the head without contrast interpreted by me shows no evidence of acute abnormalities including no intracerebral hemorrhage, obvious masses, or  significant edema  CT of the cervical spine interpreted by me does not show any evidence of acute abnormalities including no acute fracture, malalignment, height loss, or dislocation  X-ray of the right elbow interpreted independently by me and shows no evidence of fracture or dislocation -Agree with radiology assessment Official radiology report(s): DG Elbow Complete Right  Result Date: 03/20/2023 CLINICAL DATA:  Fall.  Right elbow pain. EXAM: RIGHT ELBOW - COMPLETE 3+ VIEW COMPARISON:  None Available. FINDINGS: There is no evidence of fracture, dislocation, or joint effusion. Soft tissue swelling overlying the olecranon. No radiopaque foreign body. IMPRESSION: 1. No acute fracture or dislocation of the right elbow. 2. Soft tissue swelling overlying the olecranon, compatible with olecranon bursitis. No radiopaque foreign body. Electronically Signed   By: Hart Robinsons M.D.   On: 03/20/2023 15:14   CT Head Wo Contrast  Result Date: 03/20/2023 CLINICAL DATA:  Mechanical fall while stepping down from a curb, hit head on cement, left-sided neck pain EXAM: CT HEAD WITHOUT CONTRAST CT CERVICAL SPINE WITHOUT CONTRAST TECHNIQUE: Multidetector CT imaging of the head and cervical spine was performed following the standard protocol without intravenous contrast. Multiplanar CT image reconstructions of the cervical spine were also generated. RADIATION DOSE REDUCTION:  This exam was performed according to the departmental dose-optimization program which includes automated exposure control, adjustment of the mA and/or kV according to patient size and/or use of iterative reconstruction technique. COMPARISON:  No prior CT of the head or cervical spine available; correlation is made with MRI head 05/05/2014 and MRI cervical spine 03/15/2011 FINDINGS: CT HEAD FINDINGS Brain: No evidence of acute infarct, hemorrhage, mass, mass effect, or midline shift. No hydrocephalus or extra-axial fluid collection. Unchanged  prominent extra-axial spaces, likely reflecting cerebral and cerebellar atrophy. Vascular: No hyperdense vessel. Atherosclerotic calcifications in the intracranial carotid and vertebral arteries. Skull: Negative for fracture or focal lesion. Prominence of the left lambdoid suture inferiorly appears similar to the prior MRI. Sinuses/Orbits: Mucosal thickening in the maxillary sinuses. Status post bilateral lens replacements. Other: The mastoid air cells are well aerated. CT CERVICAL SPINE FINDINGS Alignment: No traumatic listhesis. Straightening and reversal of the normal cervical lordosis. Skull base and vertebrae: No acute fracture or suspicious osseous lesion. Partial osseous fusion across the C2-C3 and C4-C7 disc spaces and facets. Soft tissues and spinal canal: No prevertebral fluid or swelling. No visible canal hematoma. Disc levels: Degenerative changes in the cervical spine.Moderate spinal canal stenosis at to C6-C7. Upper chest: No focal pulmonary opacity or pleural effusion. IMPRESSION: 1. No acute intracranial process. 2. No acute fracture or traumatic listhesis in the cervical spine. Electronically Signed   By: Wiliam Ke M.D.   On: 03/20/2023 15:05   CT Cervical Spine Wo Contrast  Result Date: 03/20/2023 CLINICAL DATA:  Mechanical fall while stepping down from a curb, hit head on cement, left-sided neck pain EXAM: CT HEAD WITHOUT CONTRAST CT CERVICAL SPINE WITHOUT CONTRAST TECHNIQUE: Multidetector CT imaging of the head and cervical spine was performed following the standard protocol without intravenous contrast. Multiplanar CT image reconstructions of the cervical spine were also generated. RADIATION DOSE REDUCTION: This exam was performed according to the departmental dose-optimization program which includes automated exposure control, adjustment of the mA and/or kV according to patient size and/or use of iterative reconstruction technique. COMPARISON:  No prior CT of the head or cervical spine  available; correlation is made with MRI head 05/05/2014 and MRI cervical spine 03/15/2011 FINDINGS: CT HEAD FINDINGS Brain: No evidence of acute infarct, hemorrhage, mass, mass effect, or midline shift. No hydrocephalus or extra-axial fluid collection. Unchanged prominent extra-axial spaces, likely reflecting cerebral and cerebellar atrophy. Vascular: No hyperdense vessel. Atherosclerotic calcifications in the intracranial carotid and vertebral arteries. Skull: Negative for fracture or focal lesion. Prominence of the left lambdoid suture inferiorly appears similar to the prior MRI. Sinuses/Orbits: Mucosal thickening in the maxillary sinuses. Status post bilateral lens replacements. Other: The mastoid air cells are well aerated. CT CERVICAL SPINE FINDINGS Alignment: No traumatic listhesis. Straightening and reversal of the normal cervical lordosis. Skull base and vertebrae: No acute fracture or suspicious osseous lesion. Partial osseous fusion across the C2-C3 and C4-C7 disc spaces and facets. Soft tissues and spinal canal: No prevertebral fluid or swelling. No visible canal hematoma. Disc levels: Degenerative changes in the cervical spine.Moderate spinal canal stenosis at to C6-C7. Upper chest: No focal pulmonary opacity or pleural effusion. IMPRESSION: 1. No acute intracranial process. 2. No acute fracture or traumatic listhesis in the cervical spine. Electronically Signed   By: Wiliam Ke M.D.   On: 03/20/2023 15:05   PROCEDURES: Critical Care performed: No Procedures MEDICATIONS ORDERED IN ED: Medications - No data to display IMPRESSION / MDM / ASSESSMENT AND PLAN / ED COURSE  I  reviewed the triage vital signs and the nursing notes.                             The patient is on the cardiac monitor to evaluate for evidence of arrhythmia and/or significant heart rate changes. Patient's presentation is most consistent with acute presentation with potential threat to life or bodily function. Presenting  after a fall that occurred just prior to arrival, resulting in injury to the right elbow. The mechanism of injury was a mechanical ground level fall without syncope or near-syncope. The current level of pain is moderate. There was no loss of consciousness, confusion, seizure, or memory impairment. There is not a laceration associated with the injury. Denies neck pain. The patient does not take blood thinner medications. Denies vomiting, numbness/weakness, fever CT of the head and neck shows no evidence of acute abnormalities, x-ray of the right elbow shows no evidence of acute abnormalities Dispo: Discharge with PCP follow-up       FINAL CLINICAL IMPRESSION(S) / ED DIAGNOSES   Final diagnoses:  Fall, initial encounter   Rx / DC Orders   ED Discharge Orders     None      Note:  This document was prepared using Dragon voice recognition software and may include unintentional dictation errors.   Merwyn Katos, MD 03/20/23 [phone]

## 2023-08-07 ENCOUNTER — Ambulatory Visit: Admission: EM | Admit: 2023-08-07 | Discharge: 2023-08-07 | Disposition: A | Discharge status: Home / Self Care

## 2023-08-07 DIAGNOSIS — J0100 Acute maxillary sinusitis, unspecified: Secondary | ICD-10-CM | POA: Diagnosis not present

## 2023-08-07 DIAGNOSIS — H6691 Otitis media, unspecified, right ear: Secondary | ICD-10-CM

## 2023-08-07 MED ORDER — AMOXICILLIN-POT CLAVULANATE 875-125 MG PO TABS: 1.0000 | ORAL_TABLET | Freq: Two times a day (BID) | ORAL | 0 refills | Status: DC

## 2023-08-07 NOTE — Discharge Instructions (Addendum)
 Take the Augmentin as directed.  Follow up with your primary care provider if your symptoms are not improving.

## 2023-08-07 NOTE — ED Triage Notes (Signed)
 Patient to Urgent Care, complaints of right sided neck swelling and "shooting" head pain. Symptoms x4 days.   Has had some nasal congestion and left sided ear fullness (x1 week). Using peroxide.

## 2023-08-07 NOTE — ED Provider Notes (Signed)
 Joseph Hensley    CSN: [id number] Arrival date & time: 08/07/23  1303      History   Chief Complaint Chief Complaint  Patient presents with   Lymphadenopathy    HPI TRASE [name] is a 78 y.o. male.  Accompanied by his wife, patient presents with left ear fullness, postnasal drip, congestion, runny nose x 1 week.  He reports right ear pain and right side neck pain x 4 days.  No OTC medications taken today.  No fever, cough, shortness of breath.  The history is provided by the patient, the spouse and medical records.    Past Medical History:  Diagnosis Date   BPH (benign prostatic hyperplasia)    Diabetes mellitus without complication (HCC)    GERD (gastroesophageal reflux disease)    History of kidney stones    Hypertension    Sleep apnea     There are no active problems to display for this patient.   Past Surgical History:  Procedure Laterality Date   CHOLECYSTECTOMY     COLONOSCOPY N/A 04/02/2017   Procedure: COLONOSCOPY;  Surgeon: Joseph Jun, MD;  Location: [facility] ENDOSCOPY;  Service: Endoscopy;  Laterality: N/A;   COLONOSCOPY WITH PROPOFOL N/A 04/02/2017   Procedure: COLONOSCOPY WITH PROPOFOL;  Surgeon: Joseph Jun, MD;  Location[facility] ENDOSCOPY;  Service: Endoscopy;  Laterality: N/A;   INCISION AND DRAINAGE ABSCESS N/A 01/20/2023   Procedure: INCISION AND DRAINAGE ABSCESS OF SCROTUM/PERINEAL;  Surgeon: Joseph Scotland, MD;  Location: [facility] ORS;  Service: Urology;  Laterality: N/A;   nasal somnoplasty     ROTATOR CUFF REPAIR Left    SCROTAL EXPLORATION N/A 01/20/2023   Procedure: SCROTUM EXPLORATION;  Surgeon: Joseph Scotland, MD;  Location: [facility] ORS;  Service: Urology;  Laterality: N/A;       Home Medications    Prior to Admission medications   Medication Sig Start Date End Date Taking? Authorizing Provider  amoxicillin-clavulanate (AUGMENTIN) 875-125 MG tablet Take 1 tablet by mouth every 12 (twelve) hours. 08/07/23  Yes Mickie Bail,  NP  acetaminophen (TYLENOL) 500 MG tablet Take 500 mg by mouth as needed.    [provider]  amLODipine (NORVASC) 10 MG tablet Take 10 mg by mouth daily.    [provider]  aspirin EC 81 MG tablet Take 81 mg by mouth daily.    [provider]  carvedilol (COREG) 6.25 MG tablet Take 6.25 mg by mouth 2 (two) times daily.    [provider]  Cholecalciferol (VITAMIN D3) 10 MCG (400 UNIT) tablet Take 400 Units by mouth daily.    [provider]  fenofibrate (TRICOR) 145 MG tablet Take 145 mg by mouth daily.    [provider]  furosemide (LASIX) 20 MG tablet Take 20 mg by mouth daily. As needed for up to 3 days for leg swelling    [provider]  gabapentin (NEURONTIN) 400 MG capsule Take 400 mg by mouth 2 (two) times daily.    [provider]  GE100 BLOOD GLUCOSE TEST test strip USE TO TEST FOUR TIMES DAILY AS DIRECTED.    [provider]  GLOBAL EASE INJECT PEN NEEDLES 31G X 8 MM MISC Inject into the skin. 01/14/23   [provider]  HUMALOG KWIKPEN 100 UNIT/ML KwikPen Inject into the skin.    [provider]  hydrochlorothiazide (HYDRODIURIL) 25 MG tablet Take 25 mg by mouth daily.    [provider]  insulin glargine (LANTUS) 100 UNIT/ML  injection Inject 75 Units into the skin 2 (two) times daily.    [provider]  insulin lispro protamine-lispro (HUMALOG 50/50 MIX) (50-50) 100 UNIT/ML SUSP injection Inject into the skin 3 (three) times daily before meals. Take 35 units before breakfast,  35units before lunch, and 35units before dinner    [provider]  Insulin Pen Needle (PEN NEEDLES 3/16") 31G X 5 MM MISC USE TO TEST 5 TIMES DAILY 10/03/22   [provider]  lisinopril (ZESTRIL) 40 MG tablet Take 40 mg by mouth daily.    [provider]  lovastatin (MEVACOR) 20 MG tablet Take 20 mg by mouth at bedtime.    [provider]  metFORMIN  (GLUCOPHAGE) 1000 MG tablet Take 1,000 mg by mouth 2 (two) times daily with a meal.    [provider]  pantoprazole (PROTONIX) 40 MG tablet Take 40 mg by mouth daily.    [provider]  triamcinolone (KENALOG) 0.025 % cream Apply topically. 08/02/22   [provider]    Family History History reviewed. No pertinent family history.  Social History Social History   Tobacco Use   Smoking status: Never   Smokeless tobacco: Never  Vaping Use   Vaping status: Never Used  Substance Use Topics   Alcohol use: No   Drug use: No     Allergies   Metoprolol   Review of Systems Review of Systems  Constitutional:  Negative for chills and fever.  HENT:  Positive for congestion, ear pain, postnasal drip, rhinorrhea and sinus pressure. Negative for sore throat.   Respiratory:  Negative for cough and shortness of breath.      Physical Exam Triage Vital Signs ED Triage Vitals  Encounter Vitals Group     BP 08/07/23 1319 (!) 145/69     Systolic BP Percentile --      Diastolic BP Percentile --      Pulse Rate 08/07/23 1319 87     Resp 08/07/23 1319 18     Temp 08/07/23 1319 97.9 F (36.6 C)     Temp src --      SpO2 08/07/23 1319 98 %     Weight --      Height --      Head Circumference --      Peak Flow --      Pain Score 08/07/23 1309 4     Pain Loc --      Pain Education --      Exclude from Growth Chart --    No data found.  Updated Vital Signs BP (!) 145/69   Pulse 87   Temp 97.9 F (36.6 C)   Resp 18   SpO2 98%   Visual Acuity Right Eye Distance:   Left Eye Distance:   Bilateral Distance:    Right Eye Near:   Left Eye Near:    Bilateral Near:     Physical Exam Constitutional:      General: He is not in acute distress. HENT:     Right Ear: Tympanic membrane is erythematous.     Left Ear: Tympanic membrane normal.     Nose: Nose normal.     Mouth/Throat:     Mouth: Mucous membranes are moist.     Pharynx: Oropharynx is  clear.     Comments: PND Cardiovascular:     Rate and Rhythm: Normal rate and regular rhythm.     Heart sounds: Normal heart sounds.  Pulmonary:  Effort: Pulmonary effort is normal. No respiratory distress.     Breath sounds: Normal breath sounds.  Neurological:     Mental Status: He is alert.      UC Treatments / Results  Labs (all labs ordered are listed, but only abnormal results are displayed) Labs Reviewed - No data to display  EKG   Radiology No results found.  Procedures Procedures (including critical care time)  Medications Ordered in UC Medications - No data to display  Initial Impression / Assessment and Plan / UC Course  I have reviewed the triage vital signs and the nursing notes.  Pertinent labs & imaging results that were available during my care of the patient were reviewed by me and considered in my medical decision making (see chart for details).    Acute sinusitis, right otitis media.  Treating with Augmentin.  Tylenol as needed.  Plain Mucinex as needed.  Education provided on sinus infection and otitis media.  Instructed patient to follow-up with his PCP if he is not improving.  He agrees to plan of care.  Final Clinical Impressions(s) / UC Diagnoses   Final diagnoses:  Acute non-recurrent maxillary sinusitis  Right otitis media, unspecified otitis media type     Discharge Instructions      Take the Augmentin as directed.  Follow-up with your primary care provider if your symptoms are not improving.      ED Prescriptions     Medication Sig Dispense Auth. Provider   amoxicillin-clavulanate (AUGMENTIN) 875-125 MG tablet Take 1 tablet by mouth every 12 (twelve) hours. 14 tablet Mickie Bail, NP      PDMP not reviewed this encounter.   Mickie Bail, NP 08/07/23 1345

## 2024-05-17 ENCOUNTER — Ambulatory Visit
Admission: EM | Admit: 2024-05-17 | Discharge: 2024-05-17 | Disposition: A | Discharge status: Home / Self Care | Attending: Emergency Medicine | Admitting: Emergency Medicine

## 2024-05-17 DIAGNOSIS — L02818 Cutaneous abscess of other sites: Secondary | ICD-10-CM

## 2024-05-17 MED ORDER — DOXYCYCLINE HYCLATE 100 MG PO CAPS: 100.0000 mg | ORAL_CAPSULE | Freq: Two times a day (BID) | ORAL | 0 refills | Status: AC

## 2024-05-17 NOTE — ED Triage Notes (Addendum)
 Patient to Urgent Care with complaints of  an abscess on his pelvis. Started 3-4 days ago, Now having some drainage. No fevers. Had some chills. Noticed some lower leg swelling and weeping.   Applying neosporin.  Diabetic.

## 2024-05-17 NOTE — ED Provider Notes (Signed)
 " CAY RALPH PELT    CSN: [id number] Arrival date & time: 05/17/24  1432      History   Chief Complaint Chief Complaint  Patient presents with   Abscess    HPI Joseph LATA Sr. is a 79 y.o. male.  Accompanied by his wife, patient presents with tender red draining abscess in his right groin x 4 days.  No fever.  He has been treating it with Neosporin and a Band-Aid.  His medical history includes diabetes, hypertension, GERD, sleep apnea, BPH, kidney stones.  Patient had a scrotal abscess in August 2024 that required surgery; done by urology at [facility].    The history is provided by the patient, the spouse and medical records.    Past Medical History:  Diagnosis Date   BPH (benign prostatic hyperplasia)    Diabetes mellitus without complication (HCC)    GERD (gastroesophageal reflux disease)    History of kidney stones    Hypertension    Sleep apnea     There are no active problems to display for this patient.   Past Surgical History:  Procedure Laterality Date   CHOLECYSTECTOMY     COLONOSCOPY N/A 04/02/2017   Procedure: COLONOSCOPY;  Surgeon: Viktoria Lamar DASEN, MD;  Location: [facility] ENDOSCOPY;  Service: Endoscopy;  Laterality: N/A;   COLONOSCOPY WITH PROPOFOL  N/A 04/02/2017   Procedure: COLONOSCOPY WITH PROPOFOL ;  Surgeon: Viktoria Lamar DASEN, MD;  Location: [facility] ENDOSCOPY;  Service: Endoscopy;  Laterality: N/A;   INCISION AND DRAINAGE ABSCESS N/A 01/20/2023   Procedure: INCISION AND DRAINAGE ABSCESS OF SCROTUM/PERINEAL;  Surgeon: Penne Knee, MD;  Location: [facility] ORS;  Service: Urology;  Laterality: N/A;   nasal somnoplasty     ROTATOR CUFF REPAIR Left    SCROTAL EXPLORATION N/A 01/20/2023   Procedure: SCROTUM EXPLORATION;  Surgeon: Penne Knee, MD;  Location: [facility] ORS;  Service: Urology;  Laterality: N/A;       Home Medications    Prior to Admission medications  Medication Sig Start Date End Date Taking? Authorizing Provider  doxycycline  (VIBRAMYCIN ) 100  MG capsule Take 1 capsule (100 mg total) by mouth 2 (two) times daily for 7 days. 05/17/24 05/24/24 Yes Corlis Burnard DEL, NP  acetaminophen  (TYLENOL ) 500 MG tablet Take 500 mg by mouth as needed.    [provider]  amLODipine (NORVASC) 10 MG tablet Take 10 mg by mouth daily.    [provider]  aspirin EC 81 MG tablet Take 81 mg by mouth daily.    [provider]  carvedilol (COREG) 6.25 MG tablet Take 6.25 mg by mouth 2 (two) times daily.    [provider]  Cholecalciferol (VITAMIN D3) 10 MCG (400 UNIT) tablet Take 400 Units by mouth daily.    [provider]  fenofibrate (TRICOR) 145 MG tablet Take 145 mg by mouth daily.    [provider]  furosemide (LASIX) 20 MG tablet Take 20 mg by mouth daily. As needed for up to 3 days for leg swelling    [provider]  gabapentin (NEURONTIN) 400 MG capsule Take 400 mg by mouth 2 (two) times daily.    [provider]  GE100 BLOOD GLUCOSE TEST test strip USE TO TEST FOUR TIMES DAILY AS DIRECTED.    [provider]  GLOBAL EASE INJECT PEN NEEDLES 31G X 8 MM MISC Inject into the skin. 01/14/23   [provider]  HUMALOG KWIKPEN 100 UNIT/ML KwikPen Inject into the skin.    [provider]  hydrochlorothiazide (HYDRODIURIL) 25 MG tablet Take 25 mg by mouth daily.    [provider]  insulin glargine (LANTUS) 100 UNIT/ML injection Inject 75 Units into the skin 2 (two) times daily.    [provider]  insulin lispro protamine-lispro (HUMALOG 50/50 MIX) (50-50) 100 UNIT/ML SUSP injection Inject into the skin 3 (three) times daily before meals. Take 35 units before breakfast,  35units before lunch, and 35units before dinner    [provider]  Insulin Pen Needle (PEN NEEDLES 3/16) 31G X 5 MM MISC USE TO TEST 5 TIMES DAILY 10/03/22   [provider]  lisinopril (ZESTRIL) 40 MG tablet Take 40 mg by mouth daily.    [provider]   lovastatin (MEVACOR) 20 MG tablet Take 20 mg by mouth at bedtime.    [provider]  metFORMIN (GLUCOPHAGE) 1000 MG tablet Take 1,000 mg by mouth 2 (two) times daily with a meal.    [provider]  pantoprazole (PROTONIX) 40 MG tablet Take 40 mg by mouth daily.    [provider]  triamcinolone (KENALOG) 0.025 % cream Apply topically. 08/02/22   [provider]    Family History History reviewed. No pertinent family history.  Social History Social History[1]   Allergies   Metoprolol   Review of Systems Review of Systems  Constitutional:  Positive for chills. Negative for fever.  Skin:  Positive for color change and wound.     Physical Exam Triage Vital Signs ED Triage Vitals  Encounter Vitals Group     BP 05/17/24 1556 (!) 153/76     Girls Systolic BP Percentile --      Girls Diastolic BP Percentile --      Boys Systolic BP Percentile --      Boys Diastolic BP Percentile --      Pulse Rate 05/17/24 1556 76     Resp 05/17/24 1556 18     Temp 05/17/24 1556 98 F (36.7 C)     Temp src --      SpO2 05/17/24 1556 98 %     Weight --      Height --      Head Circumference --      Peak Flow --      Pain Score 05/17/24 1604 3     Pain Loc --      Pain Education --      Exclude from Growth Chart --    No data found.  Updated Vital Signs BP (!) 153/76   Pulse 76   Temp 98 F (36.7 C)   Resp 18   SpO2 98%   Visual Acuity Right Eye Distance:   Left Eye Distance:   Bilateral Distance:    Right Eye Near:   Left Eye Near:    Bilateral Near:     Physical Exam Constitutional:      General: He is not in acute distress. HENT:     Mouth/Throat:     Mouth: Mucous membranes are moist.  Cardiovascular:     Rate and Rhythm: Normal rate.  Pulmonary:     Effort: Pulmonary effort is normal. No respiratory distress.  Skin:    General: Skin is warm and dry.     Findings: Lesion present.     Comments: Quarter-sized open  superficial abscess in right groin in hairline; appears to be healing; no drainage or erythema at this time.  Neurological:     Mental Status: He is  alert.      UC Treatments / Results  Labs (all labs ordered are listed, but only abnormal results are displayed) Labs Reviewed - No data to display  EKG   Radiology No results found.  Procedures Procedures (including critical care time)  Medications Ordered in UC Medications - No data to display  Initial Impression / Assessment and Plan / UC Course  I have reviewed the triage vital signs and the nursing notes.  Pertinent labs & imaging results that were available during my care of the patient were reviewed by me and considered in my medical decision making (see chart for details).    Skin abscess.  Afebrile.  No indication for I&D at this time.  Treating today with doxycycline .  Education provided on skin abscess.  Instructed him to follow-up with his PCP.  ED precautions given.  Patient and his wife agree to plan of care.  Final Clinical Impressions(s) / UC Diagnoses   Final diagnoses:  Cutaneous abscess of other site     Discharge Instructions      Take the doxycycline  as directed.  Follow up with your primary care provider.  Go to the emergency department if you have worsening symptoms.        ED Prescriptions     Medication Sig Dispense Auth. Provider   doxycycline  (VIBRAMYCIN ) 100 MG capsule Take 1 capsule (100 mg total) by mouth 2 (two) times daily for 7 days. 14 capsule Corlis Burnard DEL, NP      PDMP not reviewed this encounter.    [1]  Social History Tobacco Use   Smoking status: Never   Smokeless tobacco: Never  Vaping Use   Vaping status: Never Used  Substance Use Topics   Alcohol use: No   Drug use: No     Corlis Burnard DEL, NP 05/17/24 1642  "

## 2024-05-17 NOTE — Discharge Instructions (Addendum)
 Take the doxycycline as directed.  Follow up with your primary care provider.  Go to the emergency department if you have worsening symptoms.
